# Patient Record
Sex: Female | Born: 1970 | Race: White | Hispanic: No | Marital: Married | Smoking: Never smoker
Health system: Southern US, Community
[De-identification: ages and names within clinical notes are randomized; demographics above are authoritative.]

## PROBLEM LIST (undated history)

## (undated) DIAGNOSIS — D649 Anemia, unspecified: Secondary | ICD-10-CM

## (undated) DIAGNOSIS — C541 Malignant neoplasm of endometrium: Secondary | ICD-10-CM

## (undated) DIAGNOSIS — K900 Celiac disease: Secondary | ICD-10-CM

## (undated) DIAGNOSIS — M1990 Unspecified osteoarthritis, unspecified site: Secondary | ICD-10-CM

## (undated) HISTORY — DX: Celiac disease: K90.0

## (undated) HISTORY — DX: Anemia, unspecified: D64.9

## (undated) HISTORY — PX: EYE SURGERY: SHX253

---

## 2000-11-12 ENCOUNTER — Other Ambulatory Visit: Admission: RE | Admit: 2000-11-12 | Discharge: 2000-11-12 | Payer: Self-pay | Admitting: Obstetrics and Gynecology

## 2002-01-21 ENCOUNTER — Emergency Department (HOSPITAL_COMMUNITY): Admission: EM | Admit: 2002-01-21 | Discharge: 2002-01-21 | Payer: Self-pay | Admitting: Emergency Medicine

## 2002-08-21 ENCOUNTER — Emergency Department (HOSPITAL_COMMUNITY): Admission: EM | Admit: 2002-08-21 | Discharge: 2002-08-21 | Payer: Self-pay | Admitting: Emergency Medicine

## 2002-09-01 ENCOUNTER — Ambulatory Visit (HOSPITAL_COMMUNITY): Admission: RE | Admit: 2002-09-01 | Discharge: 2002-09-01 | Payer: Self-pay | Admitting: Obstetrics and Gynecology

## 2002-09-01 ENCOUNTER — Encounter: Payer: Self-pay | Admitting: Obstetrics and Gynecology

## 2002-10-08 ENCOUNTER — Encounter: Payer: Self-pay | Admitting: Family Medicine

## 2002-10-08 ENCOUNTER — Ambulatory Visit (HOSPITAL_COMMUNITY): Admission: RE | Admit: 2002-10-08 | Discharge: 2002-10-08 | Payer: Self-pay | Admitting: Family Medicine

## 2007-05-06 ENCOUNTER — Other Ambulatory Visit: Admission: RE | Admit: 2007-05-06 | Discharge: 2007-05-06 | Payer: Self-pay | Admitting: Obstetrics and Gynecology

## 2007-12-17 ENCOUNTER — Ambulatory Visit (HOSPITAL_COMMUNITY): Admission: RE | Admit: 2007-12-17 | Discharge: 2007-12-17 | Payer: Self-pay | Admitting: Internal Medicine

## 2008-07-25 ENCOUNTER — Other Ambulatory Visit: Admission: RE | Admit: 2008-07-25 | Discharge: 2008-07-25 | Payer: Self-pay | Admitting: Obstetrics and Gynecology

## 2010-06-12 NOTE — Op Note (Signed)
NAMECYRSTAL, LEITZ               ACCOUNT NO.:  [id number]   MEDICAL RECORD NO.:  [id number]          PATIENT TYPE:  AMB   LOCATION:  DAY                           FACILITY:  [facility]   PHYSICIAN:  Lionel December, M.D.    DATE OF BIRTH:  1971/01/10   DATE OF PROCEDURE:  DATE OF DISCHARGE:                               OPERATIVE REPORT   PROCEDURE:  Colonoscopy.   INDICATION:  Konstantina is a 40 year old Caucasian female who had 3-week  period of hematochezia when she would pass bright red blood with bowel  movements almost on a daily basis.  She had not experienced abdominal  pain, diarrhea, and/or constipation.  Her bleeding has stopped  spontaneously.  She is undergoing diagnostic colonoscopy.  Procedure and  risks were reviewed with the patient and an informed consent was  obtained.   MEDICATIONS FOR CONSCIOUS SEDATION:  Demerol 20 mg IV and Versed 5 mg IV  in divided dose.   FINDINGS:  Procedure performed in endoscopy suite.  The patient's vital  signs and O2 sat were monitored during the procedure and remained  stable.  The patient was placed in left lateral recumbent position,  rectal examination performed.  No abnormality noted on external or  digital exam.  Pentax videoscope was placed in the rectum and advanced  under vision into the sigmoid colon and beyond.  Preparation was  satisfactory.  Scope was passed into the cecum, which was identified by  appendiceal orifice and ileocecal valve.  Pictures taken for the record.  As the scope was gradually withdrawn, colonic mucosa was carefully  examined and was normal throughout.  Rectal mucosa similarly was normal.  The scope was retroflexed to examine anorectal junction and hemorrhoids  noted below the dentate line ventral thickening to mucosa or skin of  anal canal.  Endoscope was straightened and withdrawn.  The patient  tolerated the procedure well.   Scope withdrawal time was 8 minutes.   FINAL DIAGNOSES:  External  hemorrhoids, otherwise normal colonoscopy.   RECOMMENDATIONS:  Standard instructions given.   The patient advised to keep symptom diary and if bleeding is recurrent,  she will give [place] a call.      Lionel December, M.D.  Electronically Signed     NR/MEDQ  D:  12/17/2007  T:  12/18/2007  Job:  [id number]   cc:   Jeoffrey Massed, MD  Fax: [phone]

## 2010-08-02 ENCOUNTER — Other Ambulatory Visit (HOSPITAL_COMMUNITY)
Admission: RE | Admit: 2010-08-02 | Discharge: 2010-08-02 | Disposition: A | Discharge status: Home / Self Care | Payer: BC Managed Care – PPO | Source: Ambulatory Visit | Attending: Obstetrics and Gynecology | Admitting: Obstetrics and Gynecology

## 2010-08-02 DIAGNOSIS — Z01419 Encounter for gynecological examination (general) (routine) without abnormal findings: Secondary | ICD-10-CM | POA: Insufficient documentation

## 2012-04-22 ENCOUNTER — Ambulatory Visit (INDEPENDENT_AMBULATORY_CARE_PROVIDER_SITE_OTHER): Payer: BC Managed Care – PPO | Admitting: Family Medicine

## 2012-04-22 ENCOUNTER — Encounter: Payer: Self-pay | Admitting: Family Medicine

## 2012-04-22 VITALS — BP 108/78 | Temp 98.9°F | Ht 66.0 in | Wt 171.4 lb

## 2012-04-22 DIAGNOSIS — J019 Acute sinusitis, unspecified: Secondary | ICD-10-CM

## 2012-04-22 DIAGNOSIS — K900 Celiac disease: Secondary | ICD-10-CM

## 2012-04-22 DIAGNOSIS — Z0000 Encounter for general adult medical examination without abnormal findings: Secondary | ICD-10-CM

## 2012-04-22 MED ORDER — AMOXICILLIN 500 MG PO TABS: 500.0000 mg | ORAL_TABLET | Freq: Two times a day (BID) | ORAL | Status: DC

## 2012-04-22 NOTE — Patient Instructions (Addendum)

## 2012-04-22 NOTE — Progress Notes (Signed)
  Subjective:    Patient ID: AMIYA ESCAMILLA, female    DOB: 21-Aug-1970, 42 y.o.   MRN: [id number]  Sinusitis This is a new problem. The current episode started 1 to 4 weeks ago. The problem has been gradually worsening since onset. There has been no fever. Associated symptoms include congestion, coughing, ear pain (left ear), headaches, shortness of breath, sinus pressure, sneezing and a sore throat. Pertinent negatives include no chills or hoarse voice. Past treatments include lying down. The treatment provided no relief.      Review of Systems  Constitutional: Negative for fever and chills.  HENT: Positive for ear pain (left ear), congestion, sore throat, sneezing and sinus pressure. Negative for hoarse voice.   Respiratory: Positive for cough and shortness of breath.   Cardiovascular: Negative.   Gastrointestinal: Negative.   Neurological: Positive for headaches.       Objective:   Physical Exam  Constitutional: She appears well-developed and well-nourished.  HENT:  Head: Normocephalic.  Right Ear: External ear normal.  Left Ear: External ear normal.  Neck: Normal range of motion.  Cardiovascular: Normal rate, regular rhythm and normal heart sounds.   Pulmonary/Chest: No respiratory distress. She has no wheezes.  Lymphadenopathy:    She has no cervical adenopathy.          Assessment & Plan:  Celiac disease  Acute sinusitis - Plan: amoxicillin (AMOXIL) 500 MG tablet  Routine general medical examination at a health care facility - Plan: Glucose, random, Lipid panel  Z celiac disease was historical. This was not something we handled today.  Fasting glucose and cholesterol was ordered for preventative she gets her wellness exams with her gynecologist.  Followup if worse.

## 2012-05-20 ENCOUNTER — Telehealth: Payer: Self-pay | Admitting: Adult Health

## 2012-05-20 NOTE — Telephone Encounter (Signed)
Had fax from [facility] 05/04/2012 requesting refill on sertraline 50 mg #30 1 daily, refilled x6 by fax

## 2012-12-02 ENCOUNTER — Ambulatory Visit (INDEPENDENT_AMBULATORY_CARE_PROVIDER_SITE_OTHER): Payer: BC Managed Care – PPO | Admitting: Family Medicine

## 2012-12-02 ENCOUNTER — Encounter: Payer: Self-pay | Admitting: Family Medicine

## 2012-12-02 VITALS — BP 122/82 | Temp 99.0°F | Ht 66.0 in | Wt 171.6 lb

## 2012-12-02 DIAGNOSIS — J019 Acute sinusitis, unspecified: Secondary | ICD-10-CM

## 2012-12-02 MED ORDER — CEFPROZIL 500 MG PO TABS: 500.0000 mg | ORAL_TABLET | Freq: Two times a day (BID) | ORAL | Status: DC

## 2012-12-02 MED ORDER — FLUCONAZOLE 150 MG PO TABS: 150.0000 mg | ORAL_TABLET | Freq: Once | ORAL | Status: DC

## 2012-12-02 NOTE — Progress Notes (Signed)
  Subjective:    Patient ID: Jennifer Cobb, female    DOB: 1970/12/23, 42 y.o.   MRN: [id number]  HPI  Patient arrives with congestion and body aches for one week.Began last Weds Home to day Headaches and bodyahes No sweats no energy PMH benign does not smoke relates head congestion sinus pressure pain discomfort symptoms over the past week started to get better then significantly got worse.  Review of Systems Denies vomiting diarrhea rash.    Objective:   Physical Exam Mild sinus tenderness eardrums normal throat normal neck supple lungs clear heart       Assessment & Plan:  Sinusitis antibiotics prescribed warning signs discussed followup ongoing troubles.

## 2013-01-11 ENCOUNTER — Other Ambulatory Visit: Payer: Self-pay | Admitting: Adult Health

## 2013-04-05 ENCOUNTER — Encounter: Payer: Self-pay | Admitting: Family Medicine

## 2013-04-05 ENCOUNTER — Ambulatory Visit (INDEPENDENT_AMBULATORY_CARE_PROVIDER_SITE_OTHER): Payer: BC Managed Care – PPO | Admitting: Family Medicine

## 2013-04-05 VITALS — BP 128/82 | Temp 99.4°F | Ht 66.0 in | Wt 166.4 lb

## 2013-04-05 DIAGNOSIS — J329 Chronic sinusitis, unspecified: Secondary | ICD-10-CM

## 2013-04-05 MED ORDER — FLUCONAZOLE 150 MG PO TABS: ORAL_TABLET | ORAL | Status: DC

## 2013-04-05 MED ORDER — CEFDINIR 300 MG PO CAPS: 300.0000 mg | ORAL_CAPSULE | Freq: Two times a day (BID) | ORAL | Status: DC

## 2013-04-05 NOTE — Progress Notes (Signed)
   Subjective:    Patient ID: Kristie CowmanMarcia O Vacha, female    DOB: Jul 28, 1970, 43 y.o.   MRN: [id number]  Fever  This is a new problem. The current episode started in the past 7 days. Associated symptoms include headaches and nausea.  body aches and fatigue  Sig headache  tmax and fever of felt warm  Nausea no vom  Alka selt plus prn  hus has head cold  No work tod or church sunday  No cough, nasal congestion  Appetite same  Energy poor  Review of Systems  Constitutional: Positive for fever.  Gastrointestinal: Positive for nausea.  Neurological: Positive for headaches.       Objective:   Physical Exam  Alert moderate malaise. H&T moderate his congestion pharynx normal temp 99.4 lungs clear heart regular in rhythm.      Assessment & Plan:  Impression post flu sinusitis plan antibiotics prescribed. Symptomatic care discussed. Warning signs discussed. WSL

## 2013-09-11 ENCOUNTER — Other Ambulatory Visit: Payer: Self-pay | Admitting: Adult Health

## 2013-09-15 ENCOUNTER — Ambulatory Visit (INDEPENDENT_AMBULATORY_CARE_PROVIDER_SITE_OTHER): Payer: BC Managed Care – PPO | Admitting: Nurse Practitioner

## 2013-09-15 ENCOUNTER — Encounter: Payer: Self-pay | Admitting: Nurse Practitioner

## 2013-09-15 VITALS — BP 122/78 | Ht 65.0 in | Wt 164.0 lb

## 2013-09-15 DIAGNOSIS — Z1231 Encounter for screening mammogram for malignant neoplasm of breast: Secondary | ICD-10-CM

## 2013-09-15 DIAGNOSIS — Z01419 Encounter for gynecological examination (general) (routine) without abnormal findings: Secondary | ICD-10-CM

## 2013-09-15 DIAGNOSIS — Z800 Family history of malignant neoplasm of digestive organs: Secondary | ICD-10-CM

## 2013-09-15 DIAGNOSIS — Z124 Encounter for screening for malignant neoplasm of cervix: Secondary | ICD-10-CM

## 2013-09-15 DIAGNOSIS — Z0000 Encounter for general adult medical examination without abnormal findings: Secondary | ICD-10-CM

## 2013-09-16 LAB — PAP IG W/ RFLX HPV ASCU

## 2013-09-17 ENCOUNTER — Encounter: Payer: Self-pay | Admitting: Nurse Practitioner

## 2013-09-17 DIAGNOSIS — Z800 Family history of malignant neoplasm of digestive organs: Secondary | ICD-10-CM | POA: Insufficient documentation

## 2013-09-17 LAB — HUMAN PAPILLOMAVIRUS, HIGH RISK: HPV DNA HIGH RISK: NOT DETECTED

## 2013-09-17 NOTE — Progress Notes (Signed)
   Subjective:    Patient ID: Jennifer Cobb, female    DOB: [birth year], 43 y.o.   MRN: [id number]  HPI presents for her wellness exam. Married, same sexual partner. Her husband has had a vasectomy. Regular cycles about every 3 weeks, lasting 5 days with average to heavy flow. Regular vision and dental exams. Has had normal Pap smears in the past, has not had one in at least 2-3 years. 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 No copy available during office visit. Patient has been told it is normal. Gets regular colonoscopies due to her family history, her mother had colon cancer at age 45. Active lifestyle. Overall healthy diet. Unsure about her tetanus status.    Review of Systems  Constitutional: Negative for activity change, appetite change and fatigue.  HENT: Positive for rhinorrhea. Negative for dental problem, ear pain, sinus pressure and sore throat.   Respiratory: Negative for cough, chest tightness, shortness of breath and wheezing.   Cardiovascular: Negative for chest pain and leg swelling.  Gastrointestinal: Positive for constipation. Negative for nausea, vomiting, abdominal pain, diarrhea, blood in stool and abdominal distention.  Genitourinary: Negative for dysuria, urgency, frequency, vaginal discharge, enuresis, difficulty urinating, genital sores, menstrual problem and pelvic pain.       Objective:   Physical Exam  Vitals reviewed. Constitutional: She is oriented to person, place, and time. She appears well-developed. No distress.  HENT:  Right Ear: External ear normal.  Left Ear: External ear normal.  Mouth/Throat: Oropharynx is clear and moist.  Neck: Normal range of motion. Neck supple. No tracheal deviation present. No thyromegaly present.  Cardiovascular: Normal rate, regular rhythm and normal heart sounds.  Exam reveals no gallop.   No murmur heard. Pulmonary/Chest: Effort normal and breath sounds normal.  Abdominal: Soft. She exhibits no distension. There is no tenderness.    Genitourinary: Vagina normal and uterus normal. No vaginal discharge found.  External GU no rashes or lesions noted. Vagina no discharge. No CMT. Bimanual exam nontender without obvious masses.  Musculoskeletal: She exhibits no edema.  Lymphadenopathy:    She has no cervical adenopathy.  Neurological: She is alert and oriented to person, place, and time.  Skin: Skin is warm and dry. No rash noted.  Psychiatric: She has a normal mood and affect. Her behavior is normal.   breast exam dense tissue with fine nodularity, no dominant masses. Axilla no adenopathy.        Assessment & Plan:  Well woman exam - Plan: Pap IG w/ reflex to HPV when ASC-U  Screening for cervical cancer - Plan: Pap IG w/ reflex to HPV when ASC-U  Other screening mammogram - Plan: MM DIGITAL SCREENING BILATERAL  Family history of colon cancer  Encouraged healthy diet regular activity and daily vitamin D/calcium supplementation. Followup with GI specialist regarding colonoscopy. Return in about 1 year (around 09/16/2014). Recommend tetanus shot at local pharmacy because of insurance coverage.

## 2013-09-22 ENCOUNTER — Ambulatory Visit (HOSPITAL_COMMUNITY): Payer: BC Managed Care – PPO

## 2013-09-24 ENCOUNTER — Ambulatory Visit (HOSPITAL_COMMUNITY)
Admission: RE | Admit: 2013-09-24 | Discharge: 2013-09-24 | Disposition: A | Discharge status: Home / Self Care | Payer: BC Managed Care – PPO | Source: Ambulatory Visit | Attending: Nurse Practitioner | Admitting: Nurse Practitioner

## 2013-09-24 ENCOUNTER — Ambulatory Visit (HOSPITAL_COMMUNITY): Payer: BC Managed Care – PPO

## 2013-09-24 DIAGNOSIS — Z1231 Encounter for screening mammogram for malignant neoplasm of breast: Secondary | ICD-10-CM | POA: Insufficient documentation

## 2013-11-18 ENCOUNTER — Encounter: Payer: Self-pay | Admitting: Family Medicine

## 2013-11-18 ENCOUNTER — Ambulatory Visit (INDEPENDENT_AMBULATORY_CARE_PROVIDER_SITE_OTHER): Payer: BC Managed Care – PPO | Admitting: Family Medicine

## 2013-11-18 VITALS — BP 118/80 | Temp 98.2°F | Ht 65.0 in | Wt 165.0 lb

## 2013-11-18 DIAGNOSIS — J0100 Acute maxillary sinusitis, unspecified: Secondary | ICD-10-CM

## 2013-11-18 DIAGNOSIS — B349 Viral infection, unspecified: Secondary | ICD-10-CM

## 2013-11-18 MED ORDER — CEFPROZIL 500 MG PO TABS: 500.0000 mg | ORAL_TABLET | Freq: Two times a day (BID) | ORAL | Status: DC

## 2013-11-18 NOTE — Progress Notes (Signed)
   Subjective:    Patient ID: Jennifer Cobb, female    DOB: August 29, 1970, 43 y.o.   MRN: [id number]  Cough This is a new problem. The current episode started yesterday. The problem has been gradually worsening. The cough is non-productive. Associated symptoms include myalgias. Pertinent negatives include no chest pain, ear pain, fever, rhinorrhea, shortness of breath or wheezing. Nothing aggravates the symptoms. She has tried OTC cough suppressant (Tylenol yesterday ) for the symptoms. The treatment provided mild relief.    Minimal drainage Slight cough Pressure in the ears Some chill feeling No V or D  Review of Systems  Constitutional: Negative for fever and activity change.  HENT: Negative for congestion, ear pain and rhinorrhea.   Eyes: Negative for discharge.  Respiratory: Positive for cough. Negative for shortness of breath and wheezing.   Cardiovascular: Negative for chest pain.  Musculoskeletal: Positive for myalgias.       Objective:   Physical Exam  Nursing note and vitals reviewed. Constitutional: She appears well-developed.  HENT:  Head: Normocephalic.  Nose: Nose normal.  Mouth/Throat: Oropharynx is clear and moist. No oropharyngeal exudate.  Neck: Neck supple.  Cardiovascular: Normal rate and normal heart sounds.   No murmur heard. Pulmonary/Chest: Effort normal and breath sounds normal. She has no wheezes.  Lymphadenopathy:    She has no cervical adenopathy.  Skin: Skin is warm and dry.          Assessment & Plan:  Parainfluenza illness along with secondary sinusitis antibiotics prescribed warning signs discussed she is not to get the antibiotics filled unless her symptoms do not improve over the next several days certainly call if worse

## 2014-01-07 ENCOUNTER — Telehealth: Payer: Self-pay | Admitting: Family Medicine

## 2014-01-07 MED ORDER — ALBUTEROL SULFATE (2.5 MG/3ML) 0.083% IN NEBU: 2.5000 mg | INHALATION_SOLUTION | Freq: Four times a day (QID) | RESPIRATORY_TRACT | Status: DC | PRN

## 2014-01-07 NOTE — Telephone Encounter (Signed)
May give rx for 1 box albuterol vials , follow up if ongoing or worse, 1 refill as well

## 2014-01-07 NOTE — Telephone Encounter (Signed)
Patient notified and verbalized understanding. 

## 2014-01-07 NOTE — Telephone Encounter (Signed)
Patient had a bad cough for the past week.  She used 2 tubes of her child(Bryan's) breathing machine and it helped her tremendously. She wants to know if she can get her own Rx for refills on the breathing machine?  [facility]

## 2014-09-15 ENCOUNTER — Other Ambulatory Visit: Payer: Self-pay | Admitting: Adult Health

## 2014-10-19 ENCOUNTER — Ambulatory Visit (INDEPENDENT_AMBULATORY_CARE_PROVIDER_SITE_OTHER): Payer: BC Managed Care – PPO | Admitting: Nurse Practitioner

## 2014-10-19 ENCOUNTER — Encounter: Payer: Self-pay | Admitting: Family Medicine

## 2014-10-19 ENCOUNTER — Encounter: Payer: Self-pay | Admitting: Nurse Practitioner

## 2014-10-19 VITALS — BP 112/74 | Ht 65.0 in | Wt 169.0 lb

## 2014-10-19 DIAGNOSIS — M25511 Pain in right shoulder: Secondary | ICD-10-CM | POA: Diagnosis not present

## 2014-10-19 NOTE — Progress Notes (Signed)
Subjective:  Presents complaints of right shoulder pain that began 4 days ago. Has been slowly progressive, extremely bad last night, unable to sleep. Unable to move her shoulder. Has been wearing a sling since last night to help the pain. No specific history of injury. Has tried ice Aleve and ibuprofen with minimal improvement.  Objective:   BP 112/74 mmHg  Ht  (1.651 m)  Wt 169 lb (76.658 kg)  BMI 28.12 kg/m2 NAD. Alert, oriented. Nodularity with tenderness noted near the before meals area of the right shoulder in the anterior joint line. Patient is unable to move the shoulder for exam. Light palpation of the shoulder joint line produces extreme pain.  Assessment: Right shoulder pain  Plan: Patient given appointment for 1:00 today for local orthopedic specialist. Recheck here as needed.

## 2014-10-28 ENCOUNTER — Ambulatory Visit (INDEPENDENT_AMBULATORY_CARE_PROVIDER_SITE_OTHER): Payer: BC Managed Care – PPO | Admitting: Obstetrics & Gynecology

## 2014-10-28 ENCOUNTER — Encounter: Payer: Self-pay | Admitting: Obstetrics & Gynecology

## 2014-10-28 VITALS — BP 110/80 | HR 72 | Wt 162.3 lb

## 2014-10-28 DIAGNOSIS — F329 Major depressive disorder, single episode, unspecified: Secondary | ICD-10-CM

## 2014-10-28 DIAGNOSIS — F32A Depression, unspecified: Secondary | ICD-10-CM

## 2014-10-28 MED ORDER — SERTRALINE HCL 50 MG PO TABS: ORAL_TABLET | ORAL | Status: DC

## 2014-10-28 NOTE — Progress Notes (Signed)
Patient ID: Jennifer Cobb, female   DOB: 02/23/1970, 44 y.o.   MRN: [id number] Chief Complaint  Patient presents with  . Medication Refill    Zoloft    Blood pressure 110/80, pulse 72, weight 162 lb 4.8 oz (73.619 kg), last menstrual period 10/16/2014.  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  The summary and outline of the counseling and care coordination is summarized in the note above.   All questions were answered.

## 2015-03-27 ENCOUNTER — Encounter: Payer: Self-pay | Admitting: Family Medicine

## 2015-03-27 ENCOUNTER — Ambulatory Visit (INDEPENDENT_AMBULATORY_CARE_PROVIDER_SITE_OTHER): Payer: BC Managed Care – PPO | Admitting: Family Medicine

## 2015-03-27 VITALS — BP 112/68 | Temp 101.0°F | Ht 65.0 in | Wt 172.0 lb

## 2015-03-27 DIAGNOSIS — J111 Influenza due to unidentified influenza virus with other respiratory manifestations: Secondary | ICD-10-CM | POA: Diagnosis not present

## 2015-03-27 MED ORDER — OSELTAMIVIR PHOSPHATE 75 MG PO CAPS: 75.0000 mg | ORAL_CAPSULE | Freq: Two times a day (BID) | ORAL | Status: DC

## 2015-03-27 NOTE — Progress Notes (Signed)
   Subjective:    Patient ID: Jennifer Cobb, female    DOB: 1970-02-17, 45 y.o.   MRN: [id number]  Cough This is a new problem. Episode onset: 2days ago. Associated symptoms include a fever, headaches, nasal congestion, rhinorrhea and a sore throat. Pertinent negatives include no chest pain, ear pain, shortness of breath or wheezing. She has tried nothing for the symptoms.   Patient states Friday Saturday and Sunday morning start sore throat then progressed throughout the day congestion. Monday morning fever chills body aches cough runny  Symptoms over the past couple days with body aches fever chills not feeling good denies nausea vomiting diarrhea Review of Systems  Constitutional: Positive for fever. Negative for activity change.  HENT: Positive for congestion, rhinorrhea and sore throat. Negative for ear pain.   Eyes: Negative for discharge.  Respiratory: Positive for cough. Negative for shortness of breath and wheezing.   Cardiovascular: Negative for chest pain.  Neurological: Positive for headaches.       Objective:   Physical Exam  Constitutional: She appears well-developed.  HENT:  Head: Normocephalic.  Nose: Nose normal.  Mouth/Throat: Oropharynx is clear and moist. No oropharyngeal exudate.  Neck: Neck supple.  Cardiovascular: Normal rate and normal heart sounds.   No murmur heard. Pulmonary/Chest: Effort normal and breath sounds normal. She has no wheezes.  Lymphadenopathy:    She has no cervical adenopathy.  Skin: Skin is warm and dry.  Nursing note and vitals reviewed.  Patient not toxic she was warned what signs to watch for       Assessment & Plan:  Influenza-the patient was diagnosed with influenza. Patient/family educated about the flu and warning signs to watch for. If difficulty breathing, severe neck pain and stiffness, cyanosis, disorientation, or progressive worsening then immediately get rechecked at that ER. If progressive symptoms be certain to be  rechecked. Supportive measures such as Tylenol/ibuprofen was discussed. No aspirin use in children. And influenza home care instruction sheet was given. Patient does not appear toxic Tamiflu prescribed follow-up breast troubles warning signs were discussed

## 2015-03-27 NOTE — Patient Instructions (Signed)

## 2015-10-05 ENCOUNTER — Encounter: Payer: Self-pay | Admitting: Adult Health

## 2015-10-05 ENCOUNTER — Ambulatory Visit (INDEPENDENT_AMBULATORY_CARE_PROVIDER_SITE_OTHER): Payer: BC Managed Care – PPO | Admitting: Adult Health

## 2015-10-05 VITALS — BP 100/80 | HR 68 | Ht 65.0 in | Wt 179.5 lb

## 2015-10-05 DIAGNOSIS — Z3202 Encounter for pregnancy test, result negative: Secondary | ICD-10-CM | POA: Diagnosis not present

## 2015-10-05 DIAGNOSIS — N921 Excessive and frequent menstruation with irregular cycle: Secondary | ICD-10-CM

## 2015-10-05 LAB — POCT HEMOGLOBIN: HEMOGLOBIN: 12.8 g/dL (ref 12.2–16.2)

## 2015-10-05 LAB — POCT URINE PREGNANCY: PREG TEST UR: NEGATIVE

## 2015-10-05 MED ORDER — MEGESTROL ACETATE 40 MG PO TABS: ORAL_TABLET | ORAL | 1 refills | Status: DC

## 2015-10-05 NOTE — Progress Notes (Signed)
Subjective:     Patient ID: Jennifer CowmanMarcia O Cobb, female   DOB: Jun 06, 1970, 45 y.o.   MRN: [id number]  HPI [name] is a 45 year old white female, married in complaining of bleeding since 8/5, heavy at times, is moderate today. She says periods have been regular til July and skipped July and then started 8/5 and still bleeding.   Review of Systems + bleeding since 8/5 Skipped period in July Reviewed past medical,surgical, social and family history. Reviewed medications and allergies.     Objective:   Physical Exam BP 100/80 (BP Location: Left Arm, Patient Position: Sitting, Cuff Size: Normal)   Pulse 68   Ht 5\' 5"  (1.651 m)   Wt 179 lb 8 oz (81.4 kg)   LMP 09/02/2015 (Exact Date) Comment: ongoing bleeding  BMI 29.87 kg/m    UPT negative, HGB 12.8, Skin warm and dry.Pelvic: external genitalia is normal in appearance no lesions, vagina: period like blood,urethra has no lesions or masses noted, cervix:smooth and bulbous, uterus: normal size, shape and contour, non tender, no masses felt, adnexa: no masses or tenderness noted. Bladder is non tender and no masses felt.  Will RX megace to stop bleeding and get US to assess uterus but discussed that may be related to peri menopause.  Assessment:     1. Menorrhagia with irregular cycle   2. Pregnancy examination or test, negative result       Plan:         Rx megace 40 mg #45 3 x 5 days then 2 x 5 days then 1 daily with 1 refill Return next week for GYN US Review handout on menorrhagia

## 2015-10-05 NOTE — Patient Instructions (Signed)

## 2015-10-10 ENCOUNTER — Ambulatory Visit (INDEPENDENT_AMBULATORY_CARE_PROVIDER_SITE_OTHER): Payer: BC Managed Care – PPO

## 2015-10-10 DIAGNOSIS — N854 Malposition of uterus: Secondary | ICD-10-CM

## 2015-10-10 DIAGNOSIS — N921 Excessive and frequent menstruation with irregular cycle: Secondary | ICD-10-CM | POA: Diagnosis not present

## 2015-10-10 DIAGNOSIS — N83201 Unspecified ovarian cyst, right side: Secondary | ICD-10-CM

## 2015-10-10 NOTE — Progress Notes (Addendum)
PELVIC US TA/TV: Homogeneous anteverted uterus,EEC 15.2 mm,normal lt ov,simple rt ov cyst 2.5 x 3 x 2.0 cm,small amount of cul de sac fluid,ov's appear mobile,no pain during ultrasound.

## 2015-10-11 ENCOUNTER — Telehealth: Payer: Self-pay | Admitting: Adult Health

## 2015-10-11 NOTE — Telephone Encounter (Signed)
Mailbox full

## 2015-10-16 ENCOUNTER — Telehealth: Payer: Self-pay | Admitting: Adult Health

## 2015-10-16 MED ORDER — MEDROXYPROGESTERONE ACETATE 10 MG PO TABS: 10.0000 mg | ORAL_TABLET | Freq: Every day | ORAL | 0 refills | Status: DC

## 2015-10-16 NOTE — Telephone Encounter (Signed)
Pt aware US showed thickened endometrium and to stop megace and take provera 10 mg for 14 days to slough tissue,if bleeding continues may get F/U [place]

## 2015-10-16 NOTE — Telephone Encounter (Signed)
Left message to call about US  

## 2015-10-16 NOTE — Telephone Encounter (Signed)
Pt states that she would like to know her results, please contact pt

## 2015-10-18 NOTE — Telephone Encounter (Signed)
JAG spoke with pt regarding US results. Encounter closed. JSY

## 2015-11-02 ENCOUNTER — Telehealth: Payer: Self-pay | Admitting: Adult Health

## 2015-11-02 NOTE — Telephone Encounter (Signed)
She took the provera and is bleeding now, if it lasts over 7 days call me, will get F/U US to assess lining

## 2015-11-10 ENCOUNTER — Other Ambulatory Visit: Payer: Self-pay | Admitting: Obstetrics & Gynecology

## 2016-03-11 ENCOUNTER — Ambulatory Visit (INDEPENDENT_AMBULATORY_CARE_PROVIDER_SITE_OTHER): Payer: BC Managed Care – PPO | Admitting: Family Medicine

## 2016-03-11 VITALS — BP 110/78 | Temp 98.4°F | Wt 182.2 lb

## 2016-03-11 DIAGNOSIS — R070 Pain in throat: Secondary | ICD-10-CM

## 2016-03-11 DIAGNOSIS — R21 Rash and other nonspecific skin eruption: Secondary | ICD-10-CM

## 2016-03-11 LAB — POCT RAPID STREP A (OFFICE): Rapid Strep A Screen: NEGATIVE

## 2016-03-11 MED ORDER — TRIAMCINOLONE ACETONIDE 0.1 % EX CREA: TOPICAL_CREAM | CUTANEOUS | 2 refills | Status: DC

## 2016-03-11 NOTE — Progress Notes (Signed)
   Subjective:    Patient ID: Jennifer Cobb, female    DOB: 06/26/1970, 46 y.o.   MRN: [id number]  HPI Patient presents to office today c/o low grade temp (<99), fatigue, and throat pain; all started Friday.  She states Dayquil has helped some with afore mentioned symptoms. She c/o rash on back of neck that started 6 months ago. She states rash itches and is painful.  She states she has applied "bite/itch" lotion with some relief.  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   Got a flu shto   dayquil  Little cough   Mild headach ibu prn for symptoms    Rash back of neck, itches at times   Results for orders placed or performed in visit on 10/05/15  POCT urine pregnancy  Result Value Ref Range   Preg Test, Ur Negative Negative  POCT hemoglobin  Result Value Ref Range   Hemoglobin 12.8 12.2 - 16.2 g/dL   Results for orders placed or performed in visit on 03/11/16  POCT rapid strep A  Result Value Ref Range   Rapid Strep A Screen Negative Negative    Some achiness    Review of Systems No headache, no major weight loss or weight gain, no chest pain no back pain abdominal pain no change in bowel habits complete ROS otherwise negative     Objective:   Physical Exam  ,be Alert vitals stable, NAD. Blood pressure good on repeat. HEENTSlight nasal congestion slight erythema pharynx otherwise normal. Lungs clear. Heart regular rate and rhythm. Posterior neck eczema-like changes are catching nature      Assessment & Plan:  Impression 1 eczema neck #2 viral syndrome with pharyngitis negative strep screen. Patient is a Runner, broadcasting/film/videoteacher with many exposures plan symptom care for throat. 1 cream twice a day to neck symptom care discussed WSL

## 2016-03-13 LAB — CULTURE, GROUP A STREP

## 2016-03-14 MED ORDER — PENICILLIN V POTASSIUM 500 MG PO TABS: 500.0000 mg | ORAL_TABLET | Freq: Three times a day (TID) | ORAL | 0 refills | Status: DC

## 2016-03-14 NOTE — Addendum Note (Signed)
Addended by: Margaretha SheffieldBROWN, Lorena Benham S on: 03/14/2016 04:41 PM   Modules accepted: Orders

## 2016-06-27 ENCOUNTER — Encounter: Payer: Self-pay | Admitting: Family Medicine

## 2016-06-27 ENCOUNTER — Ambulatory Visit (INDEPENDENT_AMBULATORY_CARE_PROVIDER_SITE_OTHER): Payer: BC Managed Care – PPO | Admitting: Family Medicine

## 2016-06-27 VITALS — BP 100/74 | Temp 98.7°F | Ht 65.0 in | Wt 181.5 lb

## 2016-06-27 DIAGNOSIS — M542 Cervicalgia: Secondary | ICD-10-CM

## 2016-06-27 MED ORDER — CHLORZOXAZONE 500 MG PO TABS: 500.0000 mg | ORAL_TABLET | Freq: Four times a day (QID) | ORAL | 0 refills | Status: DC | PRN

## 2016-06-27 MED ORDER — DICLOFENAC SODIUM 75 MG PO TBEC
75.0000 mg | DELAYED_RELEASE_TABLET | Freq: Two times a day (BID) | ORAL | 0 refills | Status: DC
Start: 2016-06-27 — End: 2020-08-10

## 2016-06-27 NOTE — Progress Notes (Signed)
   Subjective:    Patient ID: Jennifer Cobb, female    DOB: April 09, 1970, 46 y.o.   MRN: [id number]  Neck Pain   This is a new problem. The current episode started in the past 7 days. The symptoms are aggravated by position. Stiffness is present at night. She has tried heat, ice and NSAIDs for the symptoms.   States no other concerns this visit.  Review of Systems  Musculoskeletal: Positive for neck pain.       Objective:   Physical Exam On examination she has some discomfort in the posterior aspect neck it does not have the appearance meningitis is more along the musculoskeletal region both posterior right and posterior left hurts with looking up and looking to the right. No numbness or radiation down the arm.       Assessment & Plan:  Cervical pain-muscle relaxer, anti-inflammatory, gentle range of motion, pain medicine not necessary, if progressive symptoms over next 6 weeks follow-up May need MRI.

## 2016-08-27 ENCOUNTER — Encounter: Payer: Self-pay | Admitting: Family Medicine

## 2016-09-08 ENCOUNTER — Encounter: Payer: Self-pay | Admitting: Family Medicine

## 2016-11-11 ENCOUNTER — Other Ambulatory Visit: Payer: Self-pay | Admitting: Obstetrics & Gynecology

## 2017-05-21 ENCOUNTER — Encounter: Payer: Self-pay | Admitting: Family Medicine

## 2017-05-22 NOTE — Telephone Encounter (Signed)
That is fine by me please assist family with this

## 2017-12-04 ENCOUNTER — Other Ambulatory Visit: Payer: Self-pay | Admitting: Obstetrics & Gynecology

## 2017-12-12 ENCOUNTER — Ambulatory Visit: Payer: BC Managed Care – PPO | Admitting: Family Medicine

## 2017-12-12 ENCOUNTER — Encounter: Payer: Self-pay | Admitting: Family Medicine

## 2017-12-12 VITALS — BP 128/80 | Temp 98.3°F | Ht 65.0 in | Wt 183.0 lb

## 2017-12-12 DIAGNOSIS — J069 Acute upper respiratory infection, unspecified: Secondary | ICD-10-CM

## 2017-12-12 NOTE — Progress Notes (Signed)
  Subjective:     Patient ID: Jennifer CowmanMarcia O Cobb, female   DOB: [birth year], 47 y.o.   MRN: [id number]  Sinusitis  This is a new problem. The current episode started yesterday. Associated symptoms include congestion, headaches, sneezing and a sore throat. Pertinent negatives include no chills, coughing, ear pain or shortness of breath. (Bodyaches,) Treatments tried: nyquil, antihistamine.   Yesterday started with sore throat, congestion, sneezing, intermittent frontal h/a, and body aches. Has not checked temp at home. No cough. Congestion, producing green mucous at times when she blows her nose. Has been taking tylenol and an antihistamine, which has been helpful. Works at an [employer]. Stayed home from work today.   Review of Systems  Constitutional: Negative for appetite change, chills and fever.  HENT: Positive for congestion, sneezing and sore throat. Negative for ear pain.   Eyes: Negative for discharge.  Respiratory: Negative for cough, shortness of breath and wheezing.   Gastrointestinal: Negative for diarrhea, nausea and vomiting.  Neurological: Positive for headaches.       Objective:   Physical Exam  Constitutional: She is oriented to person, place, and time. She appears well-developed and well-nourished. No distress.  HENT:  Head: Normocephalic and atraumatic.  Right Ear: Tympanic membrane normal.  Left Ear: Tympanic membrane normal.  Nose: Nose normal. No sinus tenderness.  Mouth/Throat: Uvula is midline and oropharynx is clear and moist.  Eyes: Right eye exhibits no discharge. Left eye exhibits no discharge.  Neck: Neck supple.  Cardiovascular: Normal rate, regular rhythm and normal heart sounds.  No murmur heard. Pulmonary/Chest: Effort normal and breath sounds normal. No respiratory distress. She has no wheezes.  Lymphadenopathy:    She has no cervical adenopathy.  Neurological: She is alert and oriented to person, place, and time.  Skin: Skin is warm and dry.   Psychiatric: She has a normal mood and affect.  Nursing note and vitals reviewed.      Assessment:     Viral URI      Plan:     Most likely viral etiology at this point in time. Do not feel this is strep throat or the flu. Recommend symptomatic care, may use OTC generic nasal decongestant spray x 3 days to help with congestion symptoms. Warning signs discussed, natural course of viral illnesses discussed. F/u if symptoms worsen or fail to improve.

## 2017-12-12 NOTE — Patient Instructions (Signed)
May use over the counter store brand of Afrin nasal decongestant spray, two times per day for 3 days. Do not use for longer than 3 days.

## 2018-04-13 ENCOUNTER — Other Ambulatory Visit: Payer: Self-pay

## 2018-04-13 ENCOUNTER — Ambulatory Visit (INDEPENDENT_AMBULATORY_CARE_PROVIDER_SITE_OTHER): Payer: BC Managed Care – PPO | Admitting: Family Medicine

## 2018-04-13 VITALS — Temp 98.7°F

## 2018-04-13 DIAGNOSIS — J069 Acute upper respiratory infection, unspecified: Secondary | ICD-10-CM

## 2018-04-13 DIAGNOSIS — R509 Fever, unspecified: Secondary | ICD-10-CM | POA: Diagnosis not present

## 2018-04-13 LAB — POCT INFLUENZA A/B
INFLUENZA B, POC: NEGATIVE
Influenza A, POC: NEGATIVE

## 2018-04-13 MED ORDER — OSELTAMIVIR PHOSPHATE 75 MG PO CAPS: 75.0000 mg | ORAL_CAPSULE | Freq: Two times a day (BID) | ORAL | 0 refills | Status: DC

## 2018-04-13 NOTE — Progress Notes (Signed)
   Subjective:    Patient ID: Jennifer Cobb, female    DOB: [birth year], 48 y.o.   MRN: [id number]  Fever   This is a new problem. The current episode started in the past 7 days. Associated symptoms include congestion, headaches, muscle aches and a sore throat. She has tried acetaminophen and NSAIDs for the symptoms.   Patient not feeling well relates a lot of body aches fever chills denies any headaches wheezing difficulty breathing nausea vomiting diarrhea  She denies coughing currently Review of Systems  Constitutional: Positive for fever.  HENT: Positive for congestion and sore throat.   Neurological: Positive for headaches.       Objective:   Physical Exam Vitals signs reviewed.  Constitutional:      General: She is not in acute distress. HENT:     Head: Normocephalic.  Cardiovascular:     Rate and Rhythm: Normal rate and regular rhythm.     Heart sounds: Normal heart sounds. No murmur.  Pulmonary:     Effort: Pulmonary effort is normal.     Breath sounds: Normal breath sounds.  Lymphadenopathy:     Cervical: No cervical adenopathy.  Neurological:     Mental Status: She is alert.  Psychiatric:        Behavior: Behavior normal.    Nasal swab taken Flu test negative Patient looks to feel ill but does not appear toxic O2 sat okay Not tachypneic        Assessment & Plan:  Viral syndrome Probable flu Tamiflu twice daily for 5 days If worse over the course the next 48 hours to follow-up Follow-up sooner if problems Warnings discussed

## 2018-04-14 ENCOUNTER — Telehealth: Payer: Self-pay | Admitting: Family Medicine

## 2018-04-14 NOTE — Telephone Encounter (Signed)
I did do a phone call for discussion with the patient  She states she is starting to feel a little bit better less body aches.  Having some postnasal drainage and sore throat able to eat breakfast okay.  No breathing difficulties no shortness of breath no coughing.  Tolerating her medicine.  Patient was told that she starts experiencing cough with shortness of breath to immediately notify us and we would help set her up at the hospital.  She will touch base with [place] tomorrow.

## 2018-07-24 ENCOUNTER — Ambulatory Visit (INDEPENDENT_AMBULATORY_CARE_PROVIDER_SITE_OTHER): Payer: BC Managed Care – PPO | Admitting: Nurse Practitioner

## 2018-07-24 ENCOUNTER — Other Ambulatory Visit: Payer: Self-pay

## 2018-07-24 DIAGNOSIS — H6692 Otitis media, unspecified, left ear: Secondary | ICD-10-CM

## 2018-07-24 DIAGNOSIS — M62838 Other muscle spasm: Secondary | ICD-10-CM | POA: Diagnosis not present

## 2018-07-24 MED ORDER — CEFDINIR 300 MG PO CAPS: 300.0000 mg | ORAL_CAPSULE | Freq: Two times a day (BID) | ORAL | 0 refills | Status: DC

## 2018-07-24 NOTE — Progress Notes (Signed)
   Subjective:  VIRTUAL VISIT   Patient ID: Jennifer Cobb, female    DOB: [birth year], 48 y.o.   MRN: [id number]  Neck Pain  This is a new problem. The current episode started in the past 7 days.   Patient also having left ear pain  Review of Systems  Musculoskeletal: Positive for neck pain.   Virtual Visit via Video Note  I connected with Jennifer Cobb on 07/24/18 at  3:40 PM EDT by a video enabled telemedicine application and verified that I am speaking with the correct person using two identifiers.  Location: Patient: home Provider: office   I discussed the limitations of evaluation and management by telemedicine and the availability of in person appointments. The patient expressed understanding and agreed to proceed.  History of Present Illness: Presents for complaints of left ear pain for the past 3 days.  No fever.  No drainage.  Has not been swimming.  Occasional pulsatile type pain.  No sore throat.  No cough.  No sinus headache.  Has been taking OTC analgesics for her discomfort.  Also having some pain along the left lateral neck area.   Observations/Objective: Format  Patient present at home Provider present at office Consent for interaction obtained Coronavirus outbreak made virtual visit necessary  NAD.  Alert, oriented.  Patient has tenderness when she palpates along the left sternocleidomastoid area up into the posterior auricular area.  Patient denies any lymphadenopathy or masses with palpation of the neck area.  Can perform lateral ROM of the neck with only mild tenderness.  Assessment and Plan:   ICD-10-CM   1. Left otitis media, unspecified otitis media type  H66.92   2. Muscle spasms of neck  M62.838    Meds ordered this encounter  Medications  . cefdinir (OMNICEF) 300 MG capsule    Sig: Take 1 capsule (300 mg total) by mouth 2 (two) times daily.    Dispense:  20 capsule    Refill:  0    Order Specific Question:   Supervising Provider    Answer:    Sallee Lange A [[id number]]     Follow Up Instructions: Start antibiotics as directed.  Reviewed symptomatic care for sinus congestion.  Ice/heat applications and gentle massage to the left neck area.  Stretching exercises.  Call back next week if no improvement in her symptoms, sooner if worse.  Warning signs reviewed.   I discussed the assessment and treatment plan with the patient. The patient was provided an opportunity to ask questions and all were answered. The patient agreed with the plan and demonstrated an understanding of the instructions.   The patient was advised to call back or seek an in-person evaluation if the symptoms worsen or if the condition fails to improve as anticipated.  I provided 15 minutes of non-face-to-face time during this encounter.        Objective:   Physical Exam        Assessment & Plan:

## 2018-07-25 ENCOUNTER — Encounter: Payer: Self-pay | Admitting: Nurse Practitioner

## 2018-12-08 ENCOUNTER — Other Ambulatory Visit: Payer: Self-pay | Admitting: Obstetrics & Gynecology

## 2019-04-04 ENCOUNTER — Ambulatory Visit: Payer: BC Managed Care – PPO | Attending: Internal Medicine

## 2019-04-04 DIAGNOSIS — Z23 Encounter for immunization: Secondary | ICD-10-CM | POA: Insufficient documentation

## 2019-04-04 NOTE — Progress Notes (Signed)
   Covid-19 Vaccination Clinic  Name:  SHANDELL GIOVANNI    MRN: [id number] DOB: [birth year]  04/04/2019  Ms. Dukeman was observed post Covid-19 immunization for 15 minutes without incident. She was provided with Vaccine Information Sheet and instruction to access the V-Safe system.   Ms. Leedy was instructed to call 911 with any severe reactions post vaccine: Marland Kitchen Difficulty breathing  . Swelling of face and throat  . A fast heartbeat  . A bad rash all over body  . Dizziness and weakness   Immunizations Administered    Name Date Dose VIS Date Route   Pfizer COVID-19 Vaccine 04/04/2019  3:10 PM 0.3 mL 01/08/2019 Intramuscular   Manufacturer: [employer], [employer]   Lot: [id number]   NDC: [address]-9

## 2019-04-25 ENCOUNTER — Ambulatory Visit: Payer: BC Managed Care – PPO | Attending: Internal Medicine

## 2019-04-25 DIAGNOSIS — Z23 Encounter for immunization: Secondary | ICD-10-CM

## 2019-04-25 NOTE — Progress Notes (Signed)
   Covid-19 Vaccination Clinic  Name:  Jennifer Cobb    MRN: [id number] DOB: 05-22-1970  04/25/2019  [name] was observed post Covid-19 immunization for 15 minutes without incident. She was provided with Vaccine Information Sheet and instruction to access the V-Safe system.   [name] was instructed to call 911 with any severe reactions post vaccine: Marland Kitchen Difficulty breathing  . Swelling of face and throat  . A fast heartbeat  . A bad rash all over body  . Dizziness and weakness   Immunizations Administered    Name Date Dose VIS Date Route   Pfizer COVID-19 Vaccine 04/25/2019  1:35 PM 0.3 mL 01/08/2019 Intramuscular   Manufacturer: [employer], [employer]   Lot: [id number]   NDC: [address]-0

## 2019-12-10 ENCOUNTER — Other Ambulatory Visit: Payer: Self-pay | Admitting: Obstetrics & Gynecology

## 2020-02-25 ENCOUNTER — Encounter: Payer: Self-pay | Admitting: Family Medicine

## 2020-02-25 ENCOUNTER — Other Ambulatory Visit: Payer: Self-pay

## 2020-02-25 ENCOUNTER — Ambulatory Visit (INDEPENDENT_AMBULATORY_CARE_PROVIDER_SITE_OTHER): Payer: BC Managed Care – PPO | Admitting: Family Medicine

## 2020-02-25 DIAGNOSIS — R059 Cough, unspecified: Secondary | ICD-10-CM | POA: Diagnosis not present

## 2020-02-25 DIAGNOSIS — U071 COVID-19: Secondary | ICD-10-CM | POA: Diagnosis not present

## 2020-02-25 MED ORDER — AMOXICILLIN 500 MG PO CAPS: 500.0000 mg | ORAL_CAPSULE | Freq: Three times a day (TID) | ORAL | 0 refills | Status: DC

## 2020-02-25 NOTE — Progress Notes (Signed)
   Subjective:    Patient ID: Jennifer Cobb, female    DOB: 1970/05/13, 50 y.o.   MRN: [id number]  Fever  This is a new problem. The current episode started in the past 7 days. Associated symptoms include congestion, coughing, headaches and a sore throat.  Body aches fever headache not feeling good symptoms over the past few days no wheezing or difficulty breathing.  Energy level low.  Some intermittent coughing.  PMH benign No Covid test  Review of Systems  Constitutional: Positive for fever.  HENT: Positive for congestion and sore throat.   Respiratory: Positive for cough.   Neurological: Positive for headaches.       Objective:   Physical Exam Eardrums are normal on the left whereas the right side has some slight redness but no fluid throat is normal neck no masses lungs are clear heart regular HEENT benign       Assessment & Plan:  Viral syndrome Possible Covid Cannot rule out flu Covid test taken Supportive measures discussed Follow-up if progressive troubles or problems Amoxicillin sent in to be put on hold just in case the ear gets worse Work excuse given for next week Warning signs were discussed in detail  Suspected COVID  Covid infection This is a viral process.  Mild cases are treated with supportive measures at home such as Tylenol rest fluids.  In some situations monoclonal antibodies may be appropriate depending on the patient's risk criteria.  The patient was educated regarding progressive illness including respiratory, persistent vomiting, change in mental status.  If any of these occur ER evaluation is recommended. Patient was educated about the following as well Covid-19 respiratory warning: Covid-19 is a virus that causes hypoxia (low oxygen level in blood) in some people. If you develop any changes in your usual breathing pattern: difficulty catching your breath, more short winded with activity or with resting, or anything that concerns you about your  breathing, do not hesitate to go to the emergency department immediately for evaluation. Please do not delay to get treatment.   Agrees with plan of care discussed today. Understands warning signs to seek further care: Chest pain, shortness of breath, mental confusion, profuse vomiting, any significant change in health. Understands to follow-up if symptoms do not improve, or worsen.

## 2020-02-27 LAB — SARS-COV-2, NAA 2 DAY TAT

## 2020-02-27 LAB — NOVEL CORONAVIRUS, NAA: SARS-CoV-2, NAA: DETECTED — AB

## 2020-04-10 ENCOUNTER — Encounter: Payer: Self-pay | Admitting: Family Medicine

## 2020-04-10 ENCOUNTER — Ambulatory Visit (INDEPENDENT_AMBULATORY_CARE_PROVIDER_SITE_OTHER): Payer: BC Managed Care – PPO | Admitting: Family Medicine

## 2020-04-10 ENCOUNTER — Other Ambulatory Visit: Payer: Self-pay

## 2020-04-10 VITALS — HR 71 | Temp 98.6°F | Resp 18

## 2020-04-10 DIAGNOSIS — J069 Acute upper respiratory infection, unspecified: Secondary | ICD-10-CM | POA: Diagnosis not present

## 2020-04-10 MED ORDER — CHERATUSSIN AC 100-10 MG/5ML PO SOLN: 5.0000 mL | Freq: Three times a day (TID) | ORAL | 0 refills | Status: DC | PRN

## 2020-04-10 MED ORDER — ALBUTEROL SULFATE HFA 108 (90 BASE) MCG/ACT IN AERS: 2.0000 | INHALATION_SPRAY | Freq: Four times a day (QID) | RESPIRATORY_TRACT | 0 refills | Status: DC | PRN

## 2020-04-10 NOTE — Progress Notes (Signed)
Patient ID: Jennifer Cobb, female    DOB: 1970-05-06, 50 y.o.   MRN: [id number]   Chief Complaint  Patient presents with  . Cough    Congestion, sore throat and ear pain Started the Sunday before last- Covid tested on Tuesday and it was negative   Subjective:    HPI CC-Coughing, congestion, sore throat, and left ear pain. Has been taking amox for 3 days, not much relief.  Used albuterol neb 2-3 x last week. No h/o smoking. Tested negative covid on Tuesday last week neg with home test. Had both covid vaccines and booster.  Had covid in end of Jan'22.  Saw dr. Lorin Picket in Ja '22 for sinusitis and given amoxicillin. Has been around sick contact in a play that was sick. Pt not able to tolerate prednisone.   Medical History Larine has a past medical history of Anemia and Celiac disease.   Outpatient Encounter Medications as of 04/10/2020  Medication Sig  . albuterol (VENTOLIN HFA) 108 (90 Base) MCG/ACT inhaler Inhale 2 puffs into the lungs every 6 (six) hours as needed for wheezing or shortness of breath.  . guaiFENesin-codeine (CHERATUSSIN AC) 100-10 MG/5ML syrup Take 5 mLs by mouth 3 (three) times daily as needed for cough. Caution with driving, causes sedation.  Marland Kitchen albuterol (PROVENTIL) (2.5 MG/3ML) 0.083% nebulizer solution Take 3 mLs (2.5 mg total) by nebulization every 6 (six) hours as needed for wheezing.  Marland Kitchen amoxicillin (AMOXIL) 500 MG capsule Take 1 capsule (500 mg total) by mouth 3 (three) times daily. (Patient not taking: Reported on 04/10/2020)  . chlorzoxazone (PARAFON FORTE DSC) 500 MG tablet Take 1 tablet (500 mg total) by mouth 4 (four) times daily as needed for muscle spasms.  . diclofenac (VOLTAREN) 75 MG EC tablet Take 1 tablet (75 mg total) by mouth 2 (two) times daily.  . ferrous sulfate 325 (65 FE) MG tablet Take 325 mg by mouth daily with breakfast.  . sertraline (ZOLOFT) 50 MG tablet TAKE ONE (1) TABLET BY MOUTH EVERY DAY  . triamcinolone cream (KENALOG) 0.1 %  Apply a thin layer to affected area twice daily as needed.   No facility-administered encounter medications on file as of 04/10/2020.     Review of Systems  Constitutional: Negative for chills and fever.  HENT: Positive for congestion, ear pain and sore throat. Negative for rhinorrhea.   Respiratory: Positive for cough. Negative for shortness of breath and wheezing.   Cardiovascular: Negative for chest pain and leg swelling.  Gastrointestinal: Negative for abdominal pain, diarrhea, nausea and vomiting.  Genitourinary: Negative for dysuria and frequency.  Musculoskeletal: Negative for arthralgias and back pain.  Skin: Negative for rash.  Neurological: Negative for dizziness, weakness and headaches.     Vitals Pulse 71   Temp 98.6 F (37 C) (Temporal)   Resp 18   SpO2 97%   Objective:   Physical Exam Vitals and nursing note reviewed.  Constitutional:      General: She is not in acute distress.    Appearance: Normal appearance. She is not ill-appearing or toxic-appearing.  HENT:     Head: Normocephalic and atraumatic.     Right Ear: Tympanic membrane, ear canal and external ear normal.     Left Ear: Tympanic membrane, ear canal and external ear normal.     Nose: Nose normal. No congestion or rhinorrhea.     Mouth/Throat:     Mouth: Mucous membranes are moist.     Pharynx: Oropharynx is clear. No oropharyngeal  exudate or posterior oropharyngeal erythema.  Eyes:     Extraocular Movements: Extraocular movements intact.     Conjunctiva/sclera: Conjunctivae normal.     Pupils: Pupils are equal, round, and reactive to light.  Cardiovascular:     Rate and Rhythm: Normal rate and regular rhythm.     Pulses: Normal pulses.     Heart sounds: Normal heart sounds. No murmur heard.   Pulmonary:     Effort: Pulmonary effort is normal. No respiratory distress.     Breath sounds: Normal breath sounds. No wheezing, rhonchi or rales.  Musculoskeletal:     Cervical back: Normal range  of motion.  Lymphadenopathy:     Cervical: No cervical adenopathy.  Skin:    General: Skin is warm and dry.     Findings: No rash.  Neurological:     Mental Status: She is alert and oriented to person, place, and time.  Psychiatric:        Mood and Affect: Mood normal.        Behavior: Behavior normal.    Assessment and Plan   1. Viral URI with cough - albuterol (VENTOLIN HFA) 108 (90 Base) MCG/ACT inhaler; Inhale 2 puffs into the lungs every 6 (six) hours as needed for wheezing or shortness of breath.  Dispense: 18 g; Refill: 0 - guaiFENesin-codeine (CHERATUSSIN AC) 100-10 MG/5ML syrup; Take 5 mLs by mouth 3 (three) times daily as needed for cough. Caution with driving, causes sedation.  Dispense: 120 mL; Refill: 0   Likely viral uri- Pt to take mucinex or robitussin during day, increase fluids.  Tylenol or ibuprofen for pain. flonase for congestion. Albuterol for coughing.  Pt not able to tolerate the prednisone, will hold for now. Gave cheratussin for coughing prn. call or rto in 2-3 days if not improving.  Return if symptoms worsen or fail to improve.

## 2020-04-11 ENCOUNTER — Ambulatory Visit: Payer: BC Managed Care – PPO | Admitting: Family Medicine

## 2020-07-11 ENCOUNTER — Other Ambulatory Visit: Payer: Self-pay | Admitting: Family Medicine

## 2020-07-11 ENCOUNTER — Telehealth: Payer: Self-pay

## 2020-07-11 MED ORDER — SULFACETAMIDE SODIUM 10 % OP SOLN: OPHTHALMIC | 0 refills | Status: DC

## 2020-07-11 NOTE — Telephone Encounter (Signed)
Pt contacted. Pt states she is having watery discharge and redness. Has been using OTC allergy meds with no relief. Did have URI last week. COVID test negative. No pain, no fever. Pt would like eye drops sent to [facility] due to prior obligations this afternoon. Please advise. Thank you

## 2020-07-11 NOTE — Telephone Encounter (Signed)
Medications were sent as requested follow-up if any problems may send her a MyChart message thank you

## 2020-07-11 NOTE — Telephone Encounter (Signed)
Patient notified and verbalized understanding. 

## 2020-07-11 NOTE — Telephone Encounter (Signed)
Woke up yesterday with pink eye, sticky discharge in one eye and red.  No available appts this week.  Can we all in drops?   [facility]

## 2020-07-11 NOTE — Telephone Encounter (Signed)
Nurses Please talk with patient It does sound that this appears to be conjunctivitis  Please confirm the symptoms(typical conjunctivitis would be watery mucousy discharge with some redness if she is having severe pain or fever or atypical symptoms I would want to work her in otherwise I would feel comfortable with calling it drops Most conjunctivitis is arms started with viral but can have secondary components associated with secondary infection Antibiotic drops can help shorten the length of the problem  Please talk with patient regarding her symptoms-if atypical symptoms we could do a brief visit today at 430 she could come at 420 and be worked in before closing If classic symptoms I can send in drops if she is interested

## 2020-08-08 DIAGNOSIS — S52501A Unspecified fracture of the lower end of right radius, initial encounter for closed fracture: Secondary | ICD-10-CM | POA: Insufficient documentation

## 2020-08-10 ENCOUNTER — Encounter (HOSPITAL_COMMUNITY): Payer: Self-pay | Admitting: Orthopedic Surgery

## 2020-08-10 NOTE — Progress Notes (Signed)
Spoke with pt for pre-op call. Pt denies cardiac history, HTN or Diabetes.   Pt's surgery is scheduled as ambulatory so no Covid test is required prior to surgery. Pt denies any recent Covid symptoms.

## 2020-08-10 NOTE — H&P (Addendum)
Jennifer Cobb is an 50 y.o. female.   Chief Complaint: RIGHT WRIST INJURY  HPI: The patient is a 50y/o right hand dominant female who fell off of a ladder at work on 07/24/20. She had immediate swelling and pain. She was seen initially and treated with a sugar tong splint.  She was seen in our office for further evaluation. She continues to have pain, numbness, swelling, and stiffness. Discussed the reason and rationale for surgical intervention. She has been using a wrist brace.  She is here today for surgery.  She denies chest pain, shortness of breath, fever, chills, nausea, vomiting, or diarrhea.    Past Medical History:  Diagnosis Date   Anemia    Celiac disease     Past Surgical History:  Procedure Laterality Date   EYE SURGERY Left     Family History  Problem Relation Age of Onset   Cancer Mother 76       colon   Hypertension Father    Autism Son    Syncope episode Daughter    Social History:  reports that she has never smoked. She has never used smokeless tobacco. She reports that she does not drink alcohol and does not use drugs.  Allergies:  Allergies  Allergen Reactions   Gluten Meal Diarrhea and Nausea And Vomiting    No medications prior to admission.    No results found for this or any previous visit (from the past 48 hour(s)). No results found.  ROS NO RECENT ILLNESSES OR HOSPITALIZATIONS  There were no vitals taken for this visit. Physical Exam  General Appearance:  Alert, cooperative, no distress, appears stated age  Head:  Normocephalic, without obvious abnormality, atraumatic  Eyes:  Pupils equal, conjunctiva/corneas clear,         Throat: Lips, mucosa, and tongue normal; teeth and gums normal  Neck: No visible masses     Lungs:   respirations unlabored  Chest Wall:  No tenderness or deformity  Heart:  Regular rate and rhythm,  Abdomen:   Soft, non-tender,         Extremities: RUE - MODERATE SWELLING WITHOUT ERYTHEMA OR OPEN WOUNDS.  SENSATION INTACT TO LIGHT TOUCH DISTALLY. CAPILLARY REFILL LESS THAN 2 SECONDS. LIMITED MOTION OF THE WRIST IN ALL DIRECTIONS. ABLE TO GENTLY WIGGLE ALL DIGITS. TENDERNESS TO PALPATION OF THE DISTAL RADIUS.   Pulses: 2+ and symmetric  Skin: Skin color, texture, turgor normal, no rashes or lesions     Neurologic: Normal     Assessment/Plan RIGHT DISTAL RADIUS FRACTURE     - RIGHT DISTAL RADIUS OPEN REDUCTION AND INTERNAL FIXATION WITH REPAIR AS INDICATED   WE ARE PLANNING SURGERY FOR YOUR UPPER EXTREMITY. THE RISKS AND BENEFITS OF SURGERY INCLUDE BUT NOT LIMITED TO BLEEDING INFECTION, DAMAGE TO NEARBY NERVES ARTERIES TENDONS, FAILURE OF SURGERY TO ACCOMPLISH ITS INTENDED GOALS, PERSISTENT SYMPTOMS AND NEED FOR FURTHER SURGICAL INTERVENTION. WITH THIS IN MIND WE WILL PROCEED. I HAVE DISCUSSED WITH THE PATIENT THE PRE AND POSTOPERATIVE REGIMEN AND THE DOS AND DON'TS. PT VOICED UNDERSTANDING AND INFORMED CONSENT SIGNED.   R/B/A DISCUSSED WITH PT IN OFFICE.  PT VOICED UNDERSTANDING OF PLAN CONSENT SIGNED DAY OF SURGERY PT SEEN AND EXAMINED PRIOR TO OPERATIVE PROCEDURE/DAY OF SURGERY SITE MARKED. QUESTIONS ANSWERED WILL GO HOME FOLLOWING SURGERY   Guilianna Mckoy [facility] MD  Karma Greaser 08/10/2020, 12:10 PM   R/B/A DISCUSSED WITH PT IN OFFICE.  PT VOICED UNDERSTANDING OF PLAN CONSENT SIGNED DAY OF SURGERY PT SEEN AND  EXAMINED PRIOR TO OPERATIVE PROCEDURE/DAY OF SURGERY SITE MARKED. QUESTIONS ANSWERED WILL GO HOME FOLLOWING SURGERY   Bradly Bienenstock md 08/12/20

## 2020-08-12 ENCOUNTER — Ambulatory Visit (HOSPITAL_COMMUNITY)
Admission: RE | Admit: 2020-08-12 | Discharge: 2020-08-12 | Disposition: A | Discharge status: Home / Self Care | Payer: No Typology Code available for payment source | Attending: Orthopedic Surgery | Admitting: Orthopedic Surgery

## 2020-08-12 ENCOUNTER — Encounter (HOSPITAL_COMMUNITY): Payer: Self-pay | Admitting: Orthopedic Surgery

## 2020-08-12 ENCOUNTER — Ambulatory Visit (HOSPITAL_COMMUNITY): Payer: No Typology Code available for payment source

## 2020-08-12 ENCOUNTER — Other Ambulatory Visit: Payer: Self-pay

## 2020-08-12 ENCOUNTER — Ambulatory Visit (HOSPITAL_COMMUNITY): Payer: No Typology Code available for payment source | Admitting: Certified Registered"

## 2020-08-12 ENCOUNTER — Encounter (HOSPITAL_COMMUNITY)
Admission: RE | Disposition: A | Discharge status: Home / Self Care | Payer: Self-pay | Source: Home / Self Care | Attending: Orthopedic Surgery

## 2020-08-12 DIAGNOSIS — W11XXXA Fall on and from ladder, initial encounter: Secondary | ICD-10-CM | POA: Insufficient documentation

## 2020-08-12 DIAGNOSIS — S52571A Other intraarticular fracture of lower end of right radius, initial encounter for closed fracture: Secondary | ICD-10-CM | POA: Insufficient documentation

## 2020-08-12 DIAGNOSIS — S52501A Unspecified fracture of the lower end of right radius, initial encounter for closed fracture: Secondary | ICD-10-CM

## 2020-08-12 HISTORY — DX: Unspecified osteoarthritis, unspecified site: M19.90

## 2020-08-12 HISTORY — PX: ORIF WRIST FRACTURE: SHX2133

## 2020-08-12 LAB — CBC
HCT: 44.7 % (ref 36.0–46.0)
Hemoglobin: 14.5 g/dL (ref 12.0–15.0)
MCH: 27.9 pg (ref 26.0–34.0)
MCHC: 32.4 g/dL (ref 30.0–36.0)
MCV: 86.1 fL (ref 80.0–100.0)
Platelets: 385 10*3/uL (ref 150–400)
RBC: 5.19 MIL/uL — ABNORMAL HIGH (ref 3.87–5.11)
RDW: 13.2 % (ref 11.5–15.5)
WBC: 8.2 10*3/uL (ref 4.0–10.5)
nRBC: 0 % (ref 0.0–0.2)

## 2020-08-12 SURGERY — OPEN REDUCTION INTERNAL FIXATION (ORIF) WRIST FRACTURE
Anesthesia: Monitor Anesthesia Care | Site: Wrist | Laterality: Right

## 2020-08-12 MED ORDER — FENTANYL CITRATE (PF) 250 MCG/5ML IJ SOLN
INTRAMUSCULAR | Status: AC
  Filled 2020-08-12: qty 5

## 2020-08-12 MED ORDER — MIDAZOLAM HCL 2 MG/2ML IJ SOLN: 2.0000 mg | Freq: Once | INTRAMUSCULAR | Status: AC

## 2020-08-12 MED ORDER — PROPOFOL 500 MG/50ML IV EMUL
INTRAVENOUS | Status: DC | PRN
  Administered 2020-08-12: 75 ug/kg/min via INTRAVENOUS

## 2020-08-12 MED ORDER — METHOCARBAMOL 500 MG PO TABS: 500.0000 mg | ORAL_TABLET | Freq: Three times a day (TID) | ORAL | 0 refills | Status: DC | PRN

## 2020-08-12 MED ORDER — FENTANYL CITRATE (PF) 100 MCG/2ML IJ SOLN: 50.0000 ug | Freq: Once | INTRAMUSCULAR | Status: AC

## 2020-08-12 MED ORDER — ORAL CARE MOUTH RINSE: 15.0000 mL | Freq: Once | OROMUCOSAL | Status: AC

## 2020-08-12 MED ORDER — ACETAMINOPHEN 325 MG PO TABS: 325.0000 mg | ORAL_TABLET | ORAL | Status: DC | PRN

## 2020-08-12 MED ORDER — BUPIVACAINE-EPINEPHRINE (PF) 0.5% -1:200000 IJ SOLN
INTRAMUSCULAR | Status: DC | PRN
  Administered 2020-08-12: 30 mL via PERINEURAL

## 2020-08-12 MED ORDER — LACTATED RINGERS IV SOLN: INTRAVENOUS | Status: DC

## 2020-08-12 MED ORDER — CHLORHEXIDINE GLUCONATE 0.12 % MT SOLN: 15.0000 mL | Freq: Once | OROMUCOSAL | Status: AC

## 2020-08-12 MED ORDER — OXYCODONE HCL 5 MG PO TABS: 5.0000 mg | ORAL_TABLET | Freq: Once | ORAL | Status: DC | PRN

## 2020-08-12 MED ORDER — MIDAZOLAM HCL 2 MG/2ML IJ SOLN
INTRAMUSCULAR | Status: AC
  Administered 2020-08-12: 2 mg via INTRAVENOUS
  Filled 2020-08-12: qty 2

## 2020-08-12 MED ORDER — FENTANYL CITRATE (PF) 100 MCG/2ML IJ SOLN: 25.0000 ug | INTRAMUSCULAR | Status: DC | PRN

## 2020-08-12 MED ORDER — ACETAMINOPHEN 10 MG/ML IV SOLN: 1000.0000 mg | Freq: Once | INTRAVENOUS | Status: DC | PRN

## 2020-08-12 MED ORDER — HYDROCODONE-ACETAMINOPHEN 5-325 MG PO TABS: 1.0000 | ORAL_TABLET | ORAL | 0 refills | Status: AC | PRN

## 2020-08-12 MED ORDER — CHLORHEXIDINE GLUCONATE 0.12 % MT SOLN
OROMUCOSAL | Status: AC
  Administered 2020-08-12: 15 mL via OROMUCOSAL
  Filled 2020-08-12: qty 15

## 2020-08-12 MED ORDER — AMISULPRIDE (ANTIEMETIC) 5 MG/2ML IV SOLN: 10.0000 mg | Freq: Once | INTRAVENOUS | Status: DC | PRN

## 2020-08-12 MED ORDER — 0.9 % SODIUM CHLORIDE (POUR BTL) OPTIME
TOPICAL | Status: DC | PRN
  Administered 2020-08-12: 1000 mL

## 2020-08-12 MED ORDER — DOCUSATE SODIUM 100 MG PO CAPS: 100.0000 mg | ORAL_CAPSULE | Freq: Two times a day (BID) | ORAL | 0 refills | Status: DC

## 2020-08-12 MED ORDER — PROPOFOL 10 MG/ML IV BOLUS
INTRAVENOUS | Status: DC | PRN
  Administered 2020-08-12: 20 mg via INTRAVENOUS

## 2020-08-12 MED ORDER — PROMETHAZINE HCL 25 MG/ML IJ SOLN: 6.2500 mg | INTRAMUSCULAR | Status: DC | PRN

## 2020-08-12 MED ORDER — LIDOCAINE 2% (20 MG/ML) 5 ML SYRINGE
INTRAMUSCULAR | Status: DC | PRN
  Administered 2020-08-12: 40 mg via INTRAVENOUS

## 2020-08-12 MED ORDER — ACETAMINOPHEN 160 MG/5ML PO SOLN: 325.0000 mg | ORAL | Status: DC | PRN

## 2020-08-12 MED ORDER — CEFAZOLIN SODIUM-DEXTROSE 2-4 GM/100ML-% IV SOLN
2.0000 g | INTRAVENOUS | Status: AC
  Administered 2020-08-12: 2 g via INTRAVENOUS
  Filled 2020-08-12: qty 100

## 2020-08-12 MED ORDER — FENTANYL CITRATE (PF) 100 MCG/2ML IJ SOLN
INTRAMUSCULAR | Status: AC
  Administered 2020-08-12: 50 ug via INTRAVENOUS
  Filled 2020-08-12: qty 2

## 2020-08-12 MED ORDER — OXYCODONE HCL 5 MG/5ML PO SOLN: 5.0000 mg | Freq: Once | ORAL | Status: DC | PRN

## 2020-08-12 SURGICAL SUPPLY — 66 items
BAG COUNTER SPONGE SURGICOUNT (BAG) ×2 IMPLANT
BIT DRILL 2.2 SS TIBIAL (BIT) ×2 IMPLANT
BLADE CLIPPER SURG (BLADE) IMPLANT
BNDG ELASTIC 3X5.8 VLCR STR LF (GAUZE/BANDAGES/DRESSINGS) ×2 IMPLANT
BNDG ELASTIC 4X5.8 VLCR STR LF (GAUZE/BANDAGES/DRESSINGS) ×2 IMPLANT
BNDG ESMARK 4X9 LF (GAUZE/BANDAGES/DRESSINGS) ×2 IMPLANT
BNDG GAUZE ELAST 4 BULKY (GAUZE/BANDAGES/DRESSINGS) ×2 IMPLANT
CORD BIPOLAR FORCEPS 12FT (ELECTRODE) ×2 IMPLANT
COVER SURGICAL LIGHT HANDLE (MISCELLANEOUS) ×2 IMPLANT
CUFF TOURN SGL QUICK 18X4 (TOURNIQUET CUFF) ×2 IMPLANT
CUFF TOURN SGL QUICK 24 (TOURNIQUET CUFF)
CUFF TRNQT CYL 24X4X16.5-23 (TOURNIQUET CUFF) IMPLANT
DRAIN TLS ROUND 10FR (DRAIN) IMPLANT
DRAPE OEC MINIVIEW 54X84 (DRAPES) ×2 IMPLANT
DRAPE SURG 17X11 SM STRL (DRAPES) ×2 IMPLANT
DRAPE SURG 17X23 STRL (DRAPES) ×2 IMPLANT
DRSG ADAPTIC 3X8 NADH LF (GAUZE/BANDAGES/DRESSINGS) ×2 IMPLANT
ELECT REM PT RETURN 9FT ADLT (ELECTROSURGICAL)
ELECTRODE REM PT RTRN 9FT ADLT (ELECTROSURGICAL) IMPLANT
GAUZE 4X4 16PLY ~~LOC~~+RFID DBL (SPONGE) ×2 IMPLANT
GAUZE SPONGE 4X4 12PLY STRL (GAUZE/BANDAGES/DRESSINGS) ×2 IMPLANT
GLOVE SURG ORTHO LTX SZ8 (GLOVE) ×2 IMPLANT
GLOVE SURG UNDER POLY LF SZ8.5 (GLOVE) ×2 IMPLANT
GOWN STRL REUS W/ TWL LRG LVL3 (GOWN DISPOSABLE) ×3 IMPLANT
GOWN STRL REUS W/ TWL XL LVL3 (GOWN DISPOSABLE) ×1 IMPLANT
GOWN STRL REUS W/TWL LRG LVL3 (GOWN DISPOSABLE) ×3
GOWN STRL REUS W/TWL XL LVL3 (GOWN DISPOSABLE) ×1
K-WIRE 1.6 (WIRE) ×1
K-WIRE FX5X1.6XNS BN SS (WIRE) ×1
KIT BASIN OR (CUSTOM PROCEDURE TRAY) ×2 IMPLANT
KIT TURNOVER KIT B (KITS) ×2 IMPLANT
KWIRE FX5X1.6XNS BN SS (WIRE) ×1 IMPLANT
MANIFOLD NEPTUNE II (INSTRUMENTS) ×2 IMPLANT
NEEDLE HYPO 25X1 1.5 SAFETY (NEEDLE) ×2 IMPLANT
NS IRRIG 1000ML POUR BTL (IV SOLUTION) ×2 IMPLANT
PACK ORTHO EXTREMITY (CUSTOM PROCEDURE TRAY) ×2 IMPLANT
PAD ARMBOARD 7.5X6 YLW CONV (MISCELLANEOUS) ×4 IMPLANT
PAD CAST 4YDX4 CTTN HI CHSV (CAST SUPPLIES) ×1 IMPLANT
PADDING CAST ABS 4INX4YD NS (CAST SUPPLIES) ×1
PADDING CAST ABS COTTON 4X4 ST (CAST SUPPLIES) ×1 IMPLANT
PADDING CAST COTTON 4X4 STRL (CAST SUPPLIES) ×1
PEG LOCKING SMOOTH 2.2X16 (Screw) ×2 IMPLANT
PEG LOCKING SMOOTH 2.2X18 (Peg) ×6 IMPLANT
PEG LOCKING SMOOTH 2.2X20 (Screw) ×4 IMPLANT
PLATE DVR CROSSLOCK STD RT (Plate) ×2 IMPLANT
SCREW LOCK 12X2.7X 3 LD (Screw) ×2 IMPLANT
SCREW LOCK 14X2.7X 3 LD TPR (Screw) ×3 IMPLANT
SCREW LOCK 16X2.7X 3 LD TPR (Screw) ×1 IMPLANT
SCREW LOCK 18X2.7X 3 LD TPR (Screw) ×1 IMPLANT
SCREW LOCKING 2.7X12MM (Screw) ×2 IMPLANT
SCREW LOCKING 2.7X14 (Screw) ×6 IMPLANT
SCREW LOCKING 2.7X16 (Screw) ×2 IMPLANT
SCREW LOCKING 2.7X18 (Screw) ×2 IMPLANT
SOAP 2 % CHG 4 OZ (WOUND CARE) ×2 IMPLANT
SPLINT FIBERGLASS 3X12 (CAST SUPPLIES) ×2 IMPLANT
SPONGE T-LAP 18X18 ~~LOC~~+RFID (SPONGE) ×2 IMPLANT
SUT MNCRL AB 3-0 PS2 27 (SUTURE) ×2 IMPLANT
SUT PROLENE 4 0 PS 2 18 (SUTURE) ×4 IMPLANT
SUT VIC AB 2-0 FS1 27 (SUTURE) ×2 IMPLANT
SUT VICRYL 4-0 PS2 18IN ABS (SUTURE) IMPLANT
SYR CONTROL 10ML LL (SYRINGE) IMPLANT
SYSTEM CHEST DRAIN TLS 7FR (DRAIN) IMPLANT
TOWEL GREEN STERILE (TOWEL DISPOSABLE) ×2 IMPLANT
TOWEL GREEN STERILE FF (TOWEL DISPOSABLE) ×2 IMPLANT
TUBE CONNECTING 12X1/4 (SUCTIONS) ×4 IMPLANT
WATER STERILE IRR 1000ML POUR (IV SOLUTION) ×2 IMPLANT

## 2020-08-12 NOTE — Transfer of Care (Signed)
Immediate Anesthesia Transfer of Care Note  Patient: Jennifer Cobb  Procedure(s) Performed: Right distal radius open reduction and internal fixation with repair as indicated (Right: Wrist)  Patient Location: PACU  Anesthesia Type:MAC combined with regional for post-op pain  Level of Consciousness: awake and patient cooperative  Airway & Oxygen Therapy: Patient Spontanous Breathing and Patient connected to nasal cannula oxygen  Post-op Assessment: Report given to RN, Post -op Vital signs reviewed and stable and Patient moving all extremities  Post vital signs: Reviewed and stable  Last Vitals:  Vitals Value Taken Time  BP 110/64 08/12/20 1026  Temp    Pulse 58 08/12/20 1026  Resp 15 08/12/20 1026  SpO2 100 % 08/12/20 1026  Vitals shown include unvalidated device data.  Last Pain:  Vitals:   08/12/20 0830  TempSrc:   PainSc: 0-No pain      Patients Stated Pain Goal: 3 (15/40/08 6761)  Complications: No notable events documented.

## 2020-08-12 NOTE — Anesthesia Postprocedure Evaluation (Signed)
Anesthesia Post Note  Patient: Jennifer Cobb  Procedure(s) Performed: Right distal radius open reduction and internal fixation with repair as indicated (Right: Wrist)     Patient location during evaluation: PACU Anesthesia Type: Regional Level of consciousness: awake and alert Pain management: pain level controlled Vital Signs Assessment: post-procedure vital signs reviewed and stable Respiratory status: spontaneous breathing, nonlabored ventilation, respiratory function stable and patient connected to nasal cannula oxygen Cardiovascular status: stable and blood pressure returned to baseline Postop Assessment: no apparent nausea or vomiting Anesthetic complications: no   No notable events documented.  Last Vitals:  Vitals:   08/12/20 1045 08/12/20 1100  BP: 124/84 118/79  Pulse: 67 (!) 54  Resp: 12 16  Temp: 36.6 C   SpO2: 99% 100%    Last Pain:  Vitals:   08/12/20 1100  TempSrc:   PainSc: 0-No pain                 Shelton Silvas

## 2020-08-12 NOTE — Anesthesia Preprocedure Evaluation (Signed)
Anesthesia Evaluation  Patient identified by MRN, date of birth, ID band Patient awake    Reviewed: Allergy & Precautions, NPO status , Patient's Chart, lab work & pertinent test results  Airway Mallampati: I  TM Distance: >3 FB Neck ROM: Full    Dental  (+) Teeth Intact, Dental Advisory Given   Pulmonary neg pulmonary ROS,    breath sounds clear to auscultation       Cardiovascular negative cardio ROS   Rhythm:Regular Rate:Bradycardia     Neuro/Psych negative neurological ROS     GI/Hepatic negative GI ROS, Neg liver ROS,   Endo/Other  negative endocrine ROS  Renal/GU negative Renal ROS     Musculoskeletal  (+) Arthritis ,   Abdominal   Peds  Hematology negative hematology ROS (+)   Anesthesia Other Findings   Reproductive/Obstetrics                             Anesthesia Physical Anesthesia Plan  ASA: 2  Anesthesia Plan: Regional and MAC   Post-op Pain Management:    Induction: Intravenous  PONV Risk Score and Plan: 3 and Ondansetron, Propofol infusion and Midazolam  Airway Management Planned: Natural Airway and Simple Face Mask  Additional Equipment: None  Intra-op Plan:   Post-operative Plan: Extubation in OR  Informed Consent: I have reviewed the patients History and Physical, chart, labs and discussed the procedure including the risks, benefits and alternatives for the proposed anesthesia with the patient or authorized representative who has indicated his/her understanding and acceptance.     Dental advisory given  Plan Discussed with: CRNA  Anesthesia Plan Comments:         Anesthesia Quick Evaluation

## 2020-08-12 NOTE — Anesthesia Procedure Notes (Signed)
Procedure Name: MAC Date/Time: 08/12/2020 9:30 AM Performed by: Moshe Salisbury, CRNA Pre-anesthesia Checklist: Patient identified, Emergency Drugs available, Suction available and Patient being monitored Patient Re-evaluated:Patient Re-evaluated prior to induction Placement Confirmation: positive ETCO2 Dental Injury: Teeth and Oropharynx as per pre-operative assessment

## 2020-08-12 NOTE — Anesthesia Procedure Notes (Signed)
Anesthesia Regional Block: Supraclavicular block   Pre-Anesthetic Checklist: , timeout performed,  Correct Patient, Correct Site, Correct Laterality,  Correct Procedure, Correct Position, site marked,  Risks and benefits discussed,  Surgical consent,  Pre-op evaluation,  At surgeon's request and post-op pain management  Laterality: Right  Prep: chloraprep       Needles:  Injection technique: Single-shot  Needle Type: Echogenic Stimulator Needle     Needle Length: 9cm  Needle Gauge: 21     Additional Needles:   Procedures:,,,, ultrasound used (permanent image in chart),,    Narrative:  Start time: 08/12/2020 8:00 AM End time: 08/12/2020 8:05 AM Injection made incrementally with aspirations every 5 mL.  Performed by: Personally  Anesthesiologist: Shelton Silvas, MD  Additional Notes: Patient tolerated the procedure well. Local anesthetic introduced in an incremental fashion under minimal resistance after negative aspirations. No paresthesias were elicited. After completion of the procedure, no acute issues were identified and patient continued to be monitored by RN.

## 2020-08-12 NOTE — Op Note (Signed)
PREOPERATIVE DIAGNOSIS: Right wrist intra-articular distal radius fracture 3 more fragments  POSTOPERATIVE DIAGNOSIS: Same  ATTENDING SURGEON: Dr. Bradly Bienenstock who scrubbed and present for the entire procedure  ASSISTANT SURGEON: None  ANESTHESIA: Regional with IV sedation  OPERATIVE PROCEDURE: Open treatment of right wrist intra-articular distal radius fracture 3 more fragments Right wrist brachioradialis tendon release, tendon tenotomy Radiographs 3 views right wrist  IMPLANTS: Biomet standard DVR cross lock  EBL: Minimal  RADIOGRAPHIC INTERPRETATION: AP lateral oblique views of the wrist do show the volar plate fixation in place there is good alignment in all planes  SURGICAL INDICATIONS: Patient is a right-hand-dominant female who sustained a closed injury to her right distal radius.  Patient was seen evaluate in the office and recommended undergo the above procedure.  The risks of surgery include but not limited to bleeding infection damage nearby nerves arteries or tendons loss of motion of the wrist and digits incomplete relief of symptoms and need for further surgical invention.  Signed informed consent was obtained on the day of surgery.  SURGICAL TECHNIQUE: Patient was palpated via the preoperative holding area marked apart a marker made on the right wrist indicate correct operative site.  Patient brought back operating placed supine on the anesthesia table where the regional anesthetic was administered.  Patient tolerated this well.  Well-padded Tourniquet was then placed on the right brachium and stay with the appropriate drape.  The right upper extremities then prepped and draped in normal sterile fashion.  A timeout was called the correct site identified procedure then begun.  Attention was then turned to the right wrist.  The limb was then elevated using Esmarch segmentation the tourniquet insufflated.  Longitudinal incision made directly over the FCR sheath.  Dissection carried  down through the skin and subcutaneous tissue.  The FCR sheath was opened proximally distally.  The FPL was swept all the way and the pronator quadratus was then elevated in an L-shaped fashion.  The fracture site was then exposed.  Takedown of the early fracture callus was then done.  This was an intra-articular fracture of 3 more fragments.  In order to reduce the radial column the brachial radialis was then carefully released and tendon tenotomy and release was then done off the radial styloid.  The proximal segment of the fracture was then pronated allowing for recreation of the fracture site and takedown of the dorsal periosteal bone.  Once takedown of the very early nascent malunion was done attention was then turned to internal fixation.  The volar plate was then applied.  Open reduction of the intra-articular fracture was then carried out.  It was held distally with a K wire.  Plate position was then confirmed using the mini C arm.  The oblong screw hole was then placed proximally.  After placement of the oblong screw hole distal fixation was then carried out with a combination of distal locking pegs and screws from an ulnar to radial direction.  The positions were then confirmed using the mini C arm looking specifically down the joint.  After distal fixation was achieved proximal fixation was carried out with several more locking and nonlocking screws.  The wound was then thoroughly irrigated.  Final radiographs were then obtained.  The pronator quadratus was then closed with 2-0 Vicryl.  The subcutaneous tissues closed with 3-0 Monocryl and the skin was then closed with simple 4-0 Prolene.  Adaptic dressing a sterile compressive bandage then applied.  The patient was placed in a well-padded volar  splint taken recovery in good condition.  POSTOPERATIVE PLAN: Patient be discharged to home.  See him back in the office in 2 weeks for wound check suture removal x-rays application of a short arm cast.  Placed  the therapy order the first postoperative visit begin the therapy regimen 4 weeks.  Radiographs at each visit.

## 2020-08-12 NOTE — Discharge Instructions (Signed)
KEEP BANDAGE CLEAN AND DRY °CALL OFFICE FOR F/U APPT [phone] °KEEP HAND ELEVATED ABOVE HEART °OK TO APPLY ICE TO OPERATIVE AREA °CONTACT OFFICE IF ANY WORSENING PAIN OR CONCERNS. °

## 2020-08-15 ENCOUNTER — Encounter (HOSPITAL_COMMUNITY): Payer: Self-pay | Admitting: Orthopedic Surgery

## 2020-11-18 ENCOUNTER — Encounter: Payer: Self-pay | Admitting: Family Medicine

## 2020-11-20 ENCOUNTER — Other Ambulatory Visit: Payer: Self-pay | Admitting: Family Medicine

## 2020-11-20 MED ORDER — TRIAMCINOLONE ACETONIDE 0.1 % EX CREA: TOPICAL_CREAM | CUTANEOUS | 2 refills | Status: DC

## 2020-11-20 NOTE — Telephone Encounter (Signed)
A refill of this medicine was sent to [facility] pharmacy if any ongoing troubles let us now and follow-up for further evaluation

## 2020-12-07 ENCOUNTER — Other Ambulatory Visit: Payer: Self-pay | Admitting: Obstetrics & Gynecology

## 2020-12-07 ENCOUNTER — Ambulatory Visit (INDEPENDENT_AMBULATORY_CARE_PROVIDER_SITE_OTHER): Payer: BC Managed Care – PPO | Admitting: Family Medicine

## 2020-12-07 ENCOUNTER — Encounter: Payer: Self-pay | Admitting: Family Medicine

## 2020-12-07 ENCOUNTER — Other Ambulatory Visit: Payer: Self-pay

## 2020-12-07 VITALS — Temp 97.3°F | Wt 187.8 lb

## 2020-12-07 DIAGNOSIS — J029 Acute pharyngitis, unspecified: Secondary | ICD-10-CM

## 2020-12-07 LAB — POCT RAPID STREP A (OFFICE): Rapid Strep A Screen: NEGATIVE

## 2020-12-07 NOTE — Progress Notes (Signed)
   Subjective:    Patient ID: Jennifer Cobb, female    DOB: [birth year], 50 y.o.   MRN: [id number]  HPI Pt having headache, sore throat and fatigue. Symptoms started Tuesday (12/05/20). No issues breathing. At home COVID test negative.  Patient relates sore throat fatigue tiredness headache states just does not feel good denies wheezing or difficulty breathing no vomiting or diarrhea.  No runny nose.  Review of Systems     Objective:   Physical Exam Throat minimal erythema on one side no exudate is noted no lymphadenopathy lungs are clear no crackles heart regular eardrums normal  Patient denies urinary symptoms     Assessment & Plan:  Triple swab taken Rapid strep negative Viral syndrome Rest up is recommended Hopefully will return to school on Monday If not dramatically better by Monday touch base may need further work-up including blood work Follow-up sooner if worse

## 2020-12-08 LAB — COVID-19, FLU A+B AND RSV
Influenza A, NAA: NOT DETECTED
Influenza B, NAA: NOT DETECTED
RSV, NAA: NOT DETECTED
SARS-CoV-2, NAA: NOT DETECTED

## 2020-12-08 LAB — SPECIMEN STATUS REPORT

## 2020-12-11 LAB — SPECIMEN STATUS REPORT

## 2020-12-11 LAB — CULTURE, GROUP A STREP

## 2020-12-13 ENCOUNTER — Other Ambulatory Visit: Payer: Self-pay | Admitting: Family Medicine

## 2020-12-13 MED ORDER — AMOXICILLIN 500 MG PO CAPS: 500.0000 mg | ORAL_CAPSULE | Freq: Three times a day (TID) | ORAL | 0 refills | Status: AC

## 2020-12-13 MED ORDER — FLUCONAZOLE 150 MG PO TABS: ORAL_TABLET | ORAL | 0 refills | Status: DC

## 2021-03-16 ENCOUNTER — Encounter: Payer: Self-pay | Admitting: Family Medicine

## 2021-03-16 MED ORDER — NITROFURANTOIN MONOHYD MACRO 100 MG PO CAPS: 100.0000 mg | ORAL_CAPSULE | Freq: Two times a day (BID) | ORAL | 0 refills | Status: DC

## 2021-03-16 NOTE — Telephone Encounter (Signed)
Prescription sent electronically to the pharmacy. Patient notified. 

## 2021-03-16 NOTE — Telephone Encounter (Signed)
Macrobid 1 bid for 5 days Follow up if ongoing Hope you are better soon

## 2021-03-20 ENCOUNTER — Other Ambulatory Visit (HOSPITAL_COMMUNITY): Payer: Self-pay | Admitting: Family Medicine

## 2021-03-20 DIAGNOSIS — Z1231 Encounter for screening mammogram for malignant neoplasm of breast: Secondary | ICD-10-CM

## 2021-03-21 ENCOUNTER — Other Ambulatory Visit (HOSPITAL_COMMUNITY): Payer: Self-pay | Admitting: Family Medicine

## 2021-03-21 ENCOUNTER — Other Ambulatory Visit: Payer: Self-pay

## 2021-03-21 ENCOUNTER — Ambulatory Visit (HOSPITAL_COMMUNITY)
Admission: RE | Admit: 2021-03-21 | Discharge: 2021-03-21 | Disposition: A | Discharge status: Home / Self Care | Payer: BC Managed Care – PPO | Source: Ambulatory Visit | Attending: Family Medicine | Admitting: Family Medicine

## 2021-03-21 DIAGNOSIS — Z1231 Encounter for screening mammogram for malignant neoplasm of breast: Secondary | ICD-10-CM | POA: Insufficient documentation

## 2021-03-21 DIAGNOSIS — R928 Other abnormal and inconclusive findings on diagnostic imaging of breast: Secondary | ICD-10-CM

## 2021-03-23 ENCOUNTER — Encounter: Payer: Self-pay | Admitting: Family Medicine

## 2021-04-10 ENCOUNTER — Ambulatory Visit (HOSPITAL_COMMUNITY)
Admission: RE | Admit: 2021-04-10 | Discharge: 2021-04-10 | Disposition: A | Discharge status: Home / Self Care | Payer: BC Managed Care – PPO | Source: Ambulatory Visit | Attending: Family Medicine | Admitting: Family Medicine

## 2021-04-10 ENCOUNTER — Other Ambulatory Visit: Payer: Self-pay

## 2021-04-10 DIAGNOSIS — R928 Other abnormal and inconclusive findings on diagnostic imaging of breast: Secondary | ICD-10-CM | POA: Diagnosis present

## 2021-11-20 ENCOUNTER — Encounter: Payer: Self-pay | Admitting: Nurse Practitioner

## 2021-11-20 ENCOUNTER — Ambulatory Visit: Payer: BC Managed Care – PPO | Admitting: Nurse Practitioner

## 2021-11-20 VITALS — BP 110/62 | Wt 194.8 lb

## 2021-11-20 DIAGNOSIS — M5441 Lumbago with sciatica, right side: Secondary | ICD-10-CM | POA: Diagnosis not present

## 2021-11-20 MED ORDER — CYCLOBENZAPRINE HCL 5 MG PO TABS: 5.0000 mg | ORAL_TABLET | Freq: Three times a day (TID) | ORAL | 0 refills | Status: DC | PRN

## 2021-11-20 NOTE — Progress Notes (Signed)
   Subjective:    Patient ID: Jennifer Cobb, female    DOB: [birth year], 51 y.o.   MRN: [id number]  HPI  51 year old female patient presents to clinic today with complaints of lower back pain located to the right side of her back x3 days.  Patient states that she tweaked her back when putting on her seatbelt.  Patient denies any injuries to her back.  Patient states that standing, walking, sitting all aggravate her back.  Patient states back pain is relieved with lying down and resting.    Patient denies any other symptoms such as urinary incontinence or fecal incontinence.    Review of Systems  Musculoskeletal:  Positive for back pain.       Objective:   Physical Exam Vitals reviewed.  Constitutional:      General: She is not in acute distress.    Appearance: Normal appearance. She is normal weight. She is not ill-appearing, toxic-appearing or diaphoretic.  HENT:     Head: Normocephalic and atraumatic.  Cardiovascular:     Rate and Rhythm: Normal rate and regular rhythm.     Pulses: Normal pulses.     Heart sounds: Normal heart sounds. No murmur heard. Pulmonary:     Effort: Pulmonary effort is normal. No respiratory distress.     Breath sounds: Normal breath sounds. No wheezing.  Musculoskeletal:     Comments: Point tenderness to right lower back noted to L1 or L2.  Straight leg positive for right leg.  Range of motion intact however painful  Skin:    General: Skin is warm.     Capillary Refill: Capillary refill takes less than 2 seconds.  Neurological:     Mental Status: She is alert.     Comments: Grossly intact  Psychiatric:        Mood and Affect: Mood normal.        Behavior: Behavior normal.           Assessment & Plan:   1. Acute right-sided low back pain with right-sided sciatica -Patient to trial Flexeril and use ibuprofen over-the-counter for pain management - cyclobenzaprine (FLEXERIL) 5 MG tablet; Take 1 tablet (5 mg total) by mouth 3 (three) times  daily as needed for muscle spasms.  Dispense: 30 tablet; Refill: 0 -Offered physical therapy however patient states that she rather do yoga on her own -Return to clinic if symptoms do not improve or worsen     Note:  This document was prepared using Dragon voice recognition software and may include unintentional dictation errors. Note - This record has been created using [employer].  Chart creation errors have been sought, but may not always  have been located. Such creation errors do not reflect on  the standard of medical care.

## 2021-11-20 NOTE — Addendum Note (Signed)
Addended by: Dairl Ponder on: 11/20/2021 03:56 PM   Modules accepted: Orders

## 2021-12-11 ENCOUNTER — Other Ambulatory Visit: Payer: Self-pay | Admitting: Obstetrics & Gynecology

## 2022-02-07 ENCOUNTER — Encounter: Payer: Self-pay | Admitting: Family Medicine

## 2022-02-07 ENCOUNTER — Ambulatory Visit (INDEPENDENT_AMBULATORY_CARE_PROVIDER_SITE_OTHER): Payer: BC Managed Care – PPO | Admitting: Family Medicine

## 2022-02-07 VITALS — BP 108/69 | Wt 197.2 lb

## 2022-02-07 DIAGNOSIS — Z1211 Encounter for screening for malignant neoplasm of colon: Secondary | ICD-10-CM | POA: Diagnosis not present

## 2022-02-07 DIAGNOSIS — L03112 Cellulitis of left axilla: Secondary | ICD-10-CM

## 2022-02-07 MED ORDER — DOXYCYCLINE HYCLATE 100 MG PO TABS: 100.0000 mg | ORAL_TABLET | Freq: Two times a day (BID) | ORAL | 0 refills | Status: DC

## 2022-02-07 MED ORDER — FLUCONAZOLE 150 MG PO TABS: ORAL_TABLET | ORAL | 1 refills | Status: DC

## 2022-02-07 NOTE — Patient Instructions (Signed)
Compresses 10 to 20 minutes at a time 3-4 times per day Doxycycline 1 twice daily with a snack and a tall glass of water for 7 to 10 days If progresses to abscess please let us know and follow back up with [place] ASAP thank you

## 2022-02-07 NOTE — Progress Notes (Signed)
   Subjective:    Patient ID: Jennifer Cobb, female    DOB: August 18, 1970, 52 y.o.   MRN: [id number]  HPI Pt arrives due to knot under left arm pit. Noticed 2 days ago. Area is painful and tender. Pt believes it may be just a pimple.  Denies high fever chills or sweats relates a soreness and some swelling there  Review of Systems     Objective:   Physical Exam Patient has a reddened area swollen under the left axilla consistent with a small localized infection of the follicle.       Assessment & Plan:  Recommend antibiotics warm compresses watch for abscess development   Patient was encouraged to watch for any abscess If problems follow-up immediately Female wellness recommended Colonoscopy recommended Lab work recommended

## 2022-02-08 ENCOUNTER — Encounter: Payer: Self-pay | Admitting: *Deleted

## 2022-03-08 ENCOUNTER — Telehealth: Payer: Self-pay | Admitting: *Deleted

## 2022-03-08 NOTE — Telephone Encounter (Signed)
  Procedure: Colonoscopy  Estimated body mass index is 32.82 kg/m as calculated from the following:   Height as of 08/12/20: 5\' 5"  (1.651 m).   Weight as of 02/07/22: 197 lb 3.2 oz (89.4 kg).   Have you had a colonoscopy before?  2, done by Dr. Salley Slaughter  Do you have family history of colon cancer?  Yes mother  Do you have a family history of polyps? yes  Previous colonoscopy with polyps removed? yes  Do you have a history colorectal cancer?   no  Are you diabetic?  no  Do you have a prosthetic or mechanical heart valve? no  Do you have a pacemaker/defibrillator?   no  Have you had endocarditis/atrial fibrillation?  no  Do you use supplemental oxygen/CPAP?  no  Have you had joint replacement within the last 12 months?  no  Do you tend to be constipated or have to use laxatives?  no   Do you have history of alcohol use? If yes, how much and how often.  no  Do you have history or are you using drugs? If yes, what do are you  using?  no  Have you ever had a stroke/heart attack?  no  Have you ever had a heart or other vascular stent placed,?no  Do you take weight loss medication? no  female patients,: have you had a hysterectomy? no                              are you post menopausal?                                do you still have your menstrual cycle? no    Date of last menstrual period?   Do you take any blood-thinning medications such as: (Plavix, aspirin, Coumadin, Aggrenox, Brilinta, Xarelto, Eliquis, Pradaxa, Savaysa or Effient)? no  If yes we need the name, milligram, dosage and who is prescribing doctor:               Current Outpatient Medications  Medication Sig Dispense Refill   Biotin (BIOTIN 5000) 5 MG CAPS Take by mouth.     ferrous sulfate 325 (65 FE) MG tablet Take 325 mg by mouth daily with breakfast.     loratadine (CLARITIN) 10 MG tablet Take 10 mg by mouth daily.     sertraline (ZOLOFT) 50 MG tablet TAKE ONE (1) TABLET BY MOUTH EVERY DAY 30 tablet  11   Turmeric (QC TUMERIC COMPLEX) 500 MG CAPS Take by mouth.     No current facility-administered medications for this visit.    Allergies  Allergen Reactions   Gluten Meal Diarrhea and Nausea And Vomiting

## 2022-03-19 NOTE — Telephone Encounter (Signed)
LMTRC

## 2022-03-19 NOTE — Telephone Encounter (Signed)
2009 colonoscopy with hemorrhoids. I don't know if any more recently.   Hold iron 7 days prior.   ASA 2.

## 2022-03-26 ENCOUNTER — Encounter: Payer: Self-pay | Admitting: *Deleted

## 2022-03-26 ENCOUNTER — Other Ambulatory Visit: Payer: Self-pay | Admitting: *Deleted

## 2022-03-26 MED ORDER — PEG 3350-KCL-NA BICARB-NACL 420 G PO SOLR: 4000.0000 mL | Freq: Once | ORAL | 0 refills | Status: AC

## 2022-03-26 NOTE — Telephone Encounter (Signed)
Pt has been scheduled for 04/17/22. Instructions sent via MyChart and prep sent to the pharmacy.

## 2022-03-28 ENCOUNTER — Encounter: Payer: Self-pay | Admitting: *Deleted

## 2022-03-28 ENCOUNTER — Encounter (INDEPENDENT_AMBULATORY_CARE_PROVIDER_SITE_OTHER): Payer: Self-pay | Admitting: *Deleted

## 2022-04-23 ENCOUNTER — Other Ambulatory Visit: Payer: Self-pay

## 2022-04-23 ENCOUNTER — Ambulatory Visit (HOSPITAL_COMMUNITY): Payer: BC Managed Care – PPO | Admitting: Anesthesiology

## 2022-04-23 ENCOUNTER — Encounter (HOSPITAL_COMMUNITY)
Admission: RE | Disposition: A | Discharge status: Home / Self Care | Payer: Self-pay | Source: Home / Self Care | Attending: Internal Medicine

## 2022-04-23 ENCOUNTER — Ambulatory Visit (HOSPITAL_COMMUNITY)
Admission: RE | Admit: 2022-04-23 | Discharge: 2022-04-23 | Disposition: A | Discharge status: Home / Self Care | Payer: BC Managed Care – PPO | Attending: Internal Medicine | Admitting: Internal Medicine

## 2022-04-23 ENCOUNTER — Encounter (HOSPITAL_COMMUNITY): Payer: Self-pay

## 2022-04-23 DIAGNOSIS — Z1211 Encounter for screening for malignant neoplasm of colon: Secondary | ICD-10-CM | POA: Insufficient documentation

## 2022-04-23 DIAGNOSIS — Q438 Other specified congenital malformations of intestine: Secondary | ICD-10-CM | POA: Diagnosis not present

## 2022-04-23 DIAGNOSIS — Z800 Family history of malignant neoplasm of digestive organs: Secondary | ICD-10-CM | POA: Diagnosis not present

## 2022-04-23 HISTORY — PX: COLONOSCOPY WITH PROPOFOL: SHX5780

## 2022-04-23 SURGERY — COLONOSCOPY WITH PROPOFOL
Anesthesia: General

## 2022-04-23 MED ORDER — LIDOCAINE HCL (CARDIAC) PF 100 MG/5ML IV SOSY
PREFILLED_SYRINGE | INTRAVENOUS | Status: DC | PRN
  Administered 2022-04-23: 50 mg via INTRATRACHEAL

## 2022-04-23 MED ORDER — LACTATED RINGERS IV SOLN: INTRAVENOUS | Status: DC

## 2022-04-23 MED ORDER — PROPOFOL 10 MG/ML IV BOLUS
INTRAVENOUS | Status: DC | PRN
  Administered 2022-04-23: 100 mg via INTRAVENOUS

## 2022-04-23 MED ORDER — STERILE WATER FOR IRRIGATION IR SOLN
Status: DC | PRN
  Administered 2022-04-23: 60 mL

## 2022-04-23 MED ORDER — PROPOFOL 500 MG/50ML IV EMUL
INTRAVENOUS | Status: DC | PRN
  Administered 2022-04-23: 200 ug/kg/min via INTRAVENOUS

## 2022-04-23 NOTE — H&P (Signed)
Primary Care Physician:  Kathyrn Drown, MD Primary Gastroenterologist:  Dr. Abbey Chatters  Pre-Procedure History & Physical: HPI:  Jennifer Cobb is a 52 y.o. female is here for a colonoscopy to be performed for high risk colon cancer screening purposes, family history of colon cancer in mother (in her 53s)  Past Medical History:  Diagnosis Date   Anemia    Arthritis    Celiac disease     Past Surgical History:  Procedure Laterality Date   COLONOSCOPY     EYE SURGERY Left    ORIF WRIST FRACTURE Right 08/12/2020   Procedure: Right distal radius open reduction and internal fixation with repair as indicated;  Surgeon: Iran Planas, MD;  Location: [place];  Service: Orthopedics;  Laterality: Right;  36min    Prior to Admission medications   Medication Sig Start Date End Date Taking? Authorizing Provider  acetaminophen (TYLENOL) 500 MG tablet Take 500-1,000 mg by mouth every 6 (six) hours as needed (pain.).   Yes [provider]  BIOTIN PO Take 1 capsule by mouth in the morning.   Yes [provider]  Cyanocobalamin (VITAMIN B-12 PO) Take 1 tablet by mouth in the morning.   Yes [provider]  cyclobenzaprine (FLEXERIL) 5 MG tablet Take 5 mg by mouth 3 (three) times daily as needed for muscle spasms. 11/20/21  Yes [provider]  FOLIC ACID PO Take 1 tablet by mouth in the morning.   Yes [provider]  loratadine (CLARITIN) 10 MG tablet Take 10 mg by mouth in the morning.   Yes [provider]  sertraline (ZOLOFT) 50 MG tablet TAKE ONE (1) TABLET BY MOUTH EVERY DAY 12/12/21  Yes Florian Buff, MD  Soft Lens Products (B & L SENSITIVE EYES SALINE) 0.4 % SOLN Place 1 drop into both eyes in the morning.   Yes [provider]  TURMERIC PO Take 1 capsule by mouth daily.   Yes [provider]  ferrous sulfate 325 (65 FE) MG tablet Take 325 mg by mouth daily with breakfast.    [provider]    Allergies as of  03/26/2022 - Review Complete 02/07/2022  Allergen Reaction Noted   Gluten meal Diarrhea and Nausea And Vomiting 08/10/2020    Family History  Problem Relation Age of Onset   Cancer Mother 29       colon   Hypertension Father    Autism Son    Syncope episode Daughter     Social History   Socioeconomic History   Marital status: Married    Spouse name: Not on file   Number of children: Not on file   Years of education: Not on file   Highest education level: Not on file  Occupational History   Not on file  Tobacco Use   Smoking status: Never   Smokeless tobacco: Never  Substance and Sexual Activity   Alcohol use: No   Drug use: No   Sexual activity: Yes    Birth control/protection: Surgical    Comment: vasectomy  Other Topics Concern   Not on file  Social History Narrative   Not on file   Social Determinants of Health   Financial Resource Strain: Not on file  Food Insecurity: Not on file  Transportation Needs: Not on file  Physical Activity: Not on file  Stress: Not on file  Social Connections: Not on file  Intimate Partner Violence: Not on file    Review of Systems: See HPI,  otherwise negative ROS  Physical Exam: Vital signs in last 24 hours: Temp:  [98.8 F (37.1 C)] 98.8 F (37.1 C) (03/26 1139) Pulse Rate:  [64] 64 (03/26 1139) Resp:  [13] 13 (03/26 1139) BP: (119)/(71) 119/71 (03/26 1139) SpO2:  [96 %] 96 % (03/26 1139)   General:   Alert,  Well-developed, well-nourished, pleasant and cooperative in NAD Head:  Normocephalic and atraumatic. Eyes:  Sclera clear, no icterus.   Conjunctiva pink. Ears:  Normal auditory acuity. Nose:  No deformity, discharge,  or lesions. Mouth:  No deformity or lesions, dentition normal. Neck:  Supple; no masses or thyromegaly. Lungs:  Clear throughout to auscultation.   No wheezes, crackles, or rhonchi. No acute distress. Heart:  Regular rate and rhythm; no murmurs, clicks, rubs,  or gallops. Abdomen:  Soft,  nontender and nondistended. No masses, hepatosplenomegaly or hernias noted. Normal bowel sounds, without guarding, and without rebound.   Msk:  Symmetrical without gross deformities. Normal posture. Extremities:  Without clubbing or edema. Neurologic:  Alert and  oriented x4;  grossly normal neurologically. Skin:  Intact without significant lesions or rashes. Cervical Nodes:  No significant cervical adenopathy. Psych:  Alert and cooperative. Normal mood and affect.  Impression/Plan: Jennifer Cobb is here for a colonoscopy to be performed for high risk colon cancer screening purposes, family history of colon cancer in mother (in her 17s)  The risks of the procedure including infection, bleed, or perforation as well as benefits, limitations, alternatives and imponderables have been reviewed with the patient. Questions have been answered. All parties agreeable.

## 2022-04-23 NOTE — Anesthesia Postprocedure Evaluation (Signed)
Anesthesia Post Note  Patient: Jennifer Cobb  Procedure(s) Performed: COLONOSCOPY WITH PROPOFOL  Patient location during evaluation: Phase II Anesthesia Type: General Level of consciousness: awake and alert and oriented Pain management: pain level controlled Vital Signs Assessment: post-procedure vital signs reviewed and stable Respiratory status: spontaneous breathing, nonlabored ventilation and respiratory function stable Cardiovascular status: blood pressure returned to baseline and stable Postop Assessment: no apparent nausea or vomiting Anesthetic complications: no  No notable events documented.   Last Vitals:  Vitals:   04/23/22 1139 04/23/22 1321  BP: 119/71 117/77  Pulse: 64 72  Resp: 13 14  Temp: 37.1 C 36.6 C  SpO2: 96% 99%    Last Pain:  Vitals:   04/23/22 1321  TempSrc: Oral  PainSc: 0-No pain                 [name]

## 2022-04-23 NOTE — Op Note (Signed)
[facility] Patient Name: Jennifer Cobb Procedure Date: 04/23/2022 12:11 PM MRN: [id number] Date of Birth: 1970/10/19 Attending MD: Elon Alas. Edgar Frisk, [id number] CSN: [id number] Age: 52 Admit Type: Outpatient Procedure:                Colonoscopy Indications:              Screening in patient at increased risk: Colorectal                            cancer in mother before age 34 Providers:                Elon Alas. Abbey Chatters, DO, Caprice Kluver, [place]                            Risa Grill, Technician Referring MD:              Medicines:                See the Anesthesia note for documentation of the                            administered medications Complications:            No immediate complications. Estimated Blood Loss:     Estimated blood loss: none. Procedure:                Pre-Anesthesia Assessment:                           - The anesthesia plan was to use monitored                            anesthesia care (MAC).                           After obtaining informed consent, the colonoscope                            was passed under direct vision. Throughout the                            procedure, the patient's blood pressure, pulse, and                            oxygen saturations were monitored continuously. The                            [id number]-HQ190L [id number]) scope was introduced through                            the anus and advanced to the the cecum, identified                            by appendiceal orifice and ileocecal valve. The                            colonoscopy was technically  difficult and complex                            due to significant looping and a tortuous colon.                            Successful completion of the procedure was aided by                            applying abdominal pressure. The patient tolerated                            the procedure well. The quality of the bowel                            preparation was  evaluated using the BBPS [facility]                            Bowel Preparation Scale) with scores of: Right                            Colon = 3, Transverse Colon = 3 and Left Colon = 3                            (entire mucosa seen well with no residual staining,                            small fragments of stool or opaque liquid). The                            total BBPS score equals 9. Scope In: 1:00:38 PM Scope Out: 1:17:47 PM Scope Withdrawal Time: 0 hours 8 minutes 52 seconds  Total Procedure Duration: 0 hours 17 minutes 9 seconds  Findings:      The colon (entire examined portion) was moderately tortuous. Advancing       the scope required applying abdominal pressure.      The exam was otherwise without abnormality. Impression:               - Tortuous colon.                           - The examination was otherwise normal.                           - No specimens collected. Moderate Sedation:      Per Anesthesia Care Recommendation:           - Patient has a contact number available for                            emergencies. The signs and symptoms of potential                            delayed complications were discussed with the  patient. Return to normal activities tomorrow.                            Written discharge instructions were provided to the                            patient.                           - Resume previous diet.                           - Continue present medications.                           - Repeat colonoscopy in 5 years for high risk                            screening purposes/family history of colon cancer                            in mother.                           - Return to GI clinic PRN. Procedure Code(s):        --- Professional ---                           [id number], Colorectal cancer screening; colonoscopy on                            individual at high risk Diagnosis Code(s):        --- Professional  ---                           Z80.0, Family history of malignant neoplasm of                            digestive organs                           Q43.8, Other specified congenital malformations of                            intestine CPT copyright 2022 American Medical Association. All rights reserved. The codes documented in this report are preliminary and upon coder review may  be revised to meet current compliance requirements. Elon Alas. Abbey Chatters, DO [place] Abbey Chatters, DO 04/23/2022 1:20:13 PM This report has been signed electronically. Number of Addenda: 0

## 2022-04-23 NOTE — Discharge Instructions (Addendum)
  Colonoscopy Discharge Instructions  Read the instructions outlined below and refer to this sheet in the next few weeks. These discharge instructions provide you with general information on caring for yourself after you leave the hospital. Your doctor may also give you specific instructions. While your treatment has been planned according to the most current medical practices available, unavoidable complications occasionally occur.   ACTIVITY You may resume your regular activity, but move at a slower pace for the next 24 hours.  Take frequent rest periods for the next 24 hours.  Walking will help get rid of the air and reduce the bloated feeling in your belly (abdomen).  No driving for 24 hours (because of the medicine (anesthesia) used during the test).   Do not sign any important legal documents or operate any machinery for 24 hours (because of the anesthesia used during the test).  NUTRITION Drink plenty of fluids.  You may resume your normal diet as instructed by your doctor.  Begin with a light meal and progress to your normal diet. Heavy or fried foods are harder to digest and may make you feel sick to your stomach (nauseated).  Avoid alcoholic beverages for 24 hours or as instructed.  MEDICATIONS You may resume your normal medications unless your doctor tells you otherwise.  WHAT YOU CAN EXPECT TODAY Some feelings of bloating in the abdomen.  Passage of more gas than usual.  Spotting of blood in your stool or on the toilet paper.  IF YOU HAD POLYPS REMOVED DURING THE COLONOSCOPY: No aspirin products for 7 days or as instructed.  No alcohol for 7 days or as instructed.  Eat a soft diet for the next 24 hours.  FINDING OUT THE RESULTS OF YOUR TEST Not all test results are available during your visit. If your test results are not back during the visit, make an appointment with your caregiver to find out the results. Do not assume everything is normal if you have not heard from your  caregiver or the medical facility. It is important for you to follow up on all of your test results.  SEEK IMMEDIATE MEDICAL ATTENTION IF: You have more than a spotting of blood in your stool.  Your belly is swollen (abdominal distention).  You are nauseated or vomiting.  You have a temperature over 101.  You have abdominal pain or discomfort that is severe or gets worse throughout the day.   Your colonoscopy was relatively unremarkable.  I did not find any polyps or evidence of colon cancer.  I recommend repeating colonoscopy in 5 years given your family history of colon cancer. Otherwise follow-up with GI as needed.   I hope you have a great rest of your week!  Jennifer Cobb. Abbey Chatters, D.O. Gastroenterology and Hepatology [facility]

## 2022-04-23 NOTE — Transfer of Care (Signed)
Immediate Anesthesia Transfer of Care Note  Patient: Jennifer Cobb  Procedure(s) Performed: COLONOSCOPY WITH PROPOFOL  Patient Location: Endoscopy Unit  Anesthesia Type:General  Level of Consciousness: awake, alert , oriented, and patient cooperative  Airway & Oxygen Therapy: Patient Spontanous Breathing  Post-op Assessment: Report given to RN, Post -op Vital signs reviewed and stable, and Patient moving all extremities  Post vital signs: Reviewed and stable  Last Vitals:  Vitals Value Taken Time  BP    Temp    Pulse    Resp    SpO2      Last Pain:  Vitals:   04/23/22 1253  TempSrc:   PainSc: 0-No pain         Complications: No notable events documented.

## 2022-04-23 NOTE — Anesthesia Preprocedure Evaluation (Addendum)
Anesthesia Evaluation  Patient identified by MRN, date of birth, ID band Patient awake    Reviewed: Allergy & Precautions, H&P , NPO status , Patient's Chart, lab work & pertinent test results  Airway Mallampati: II  TM Distance: >3 FB Neck ROM: Full    Dental  (+) Dental Advisory Given   Pulmonary neg pulmonary ROS   Pulmonary exam normal breath sounds clear to auscultation       Cardiovascular negative cardio ROS Normal cardiovascular exam Rhythm:Regular Rate:Normal     Neuro/Psych negative neurological ROS  negative psych ROS   GI/Hepatic negative GI ROS, Neg liver ROS,,,  Endo/Other  negative endocrine ROS    Renal/GU negative Renal ROS  negative genitourinary   Musculoskeletal  (+) Arthritis , Osteoarthritis,    Abdominal   Peds negative pediatric ROS (+)  Hematology  (+) Blood dyscrasia, anemia   Anesthesia Other Findings   Reproductive/Obstetrics negative OB ROS                             Anesthesia Physical Anesthesia Plan  ASA: 2  Anesthesia Plan: General   Post-op Pain Management: Minimal or no pain anticipated   Induction: Intravenous  PONV Risk Score and Plan: 1 and Treatment may vary due to age or medical condition and Propofol infusion  Airway Management Planned: Nasal Cannula and Natural Airway  Additional Equipment:   Intra-op Plan:   Post-operative Plan:   Informed Consent: I have reviewed the patients History and Physical, chart, labs and discussed the procedure including the risks, benefits and alternatives for the proposed anesthesia with the patient or authorized representative who has indicated his/her understanding and acceptance.     Dental advisory given  Plan Discussed with: CRNA and Surgeon  Anesthesia Plan Comments:         Anesthesia Quick Evaluation

## 2022-05-02 ENCOUNTER — Encounter (HOSPITAL_COMMUNITY): Payer: Self-pay | Admitting: Internal Medicine

## 2022-11-07 IMAGING — MG MM DIGITAL SCREENING BILAT W/ TOMO AND CAD
8 series · 8 of 24 positions shown · non-contrast
Comparison: 09/24/2013

CLINICAL DATA: Screening.

EXAM:
DIGITAL SCREENING BILATERAL MAMMOGRAM WITH TOMOSYNTHESIS AND CAD
TECHNIQUE: Bilateral screening digital craniocaudal and mediolateral oblique
mammograms were obtained. Bilateral screening digital breast
tomosynthesis was performed. The images were evaluated with
computer-aided detection.

[R CC synth-2D]
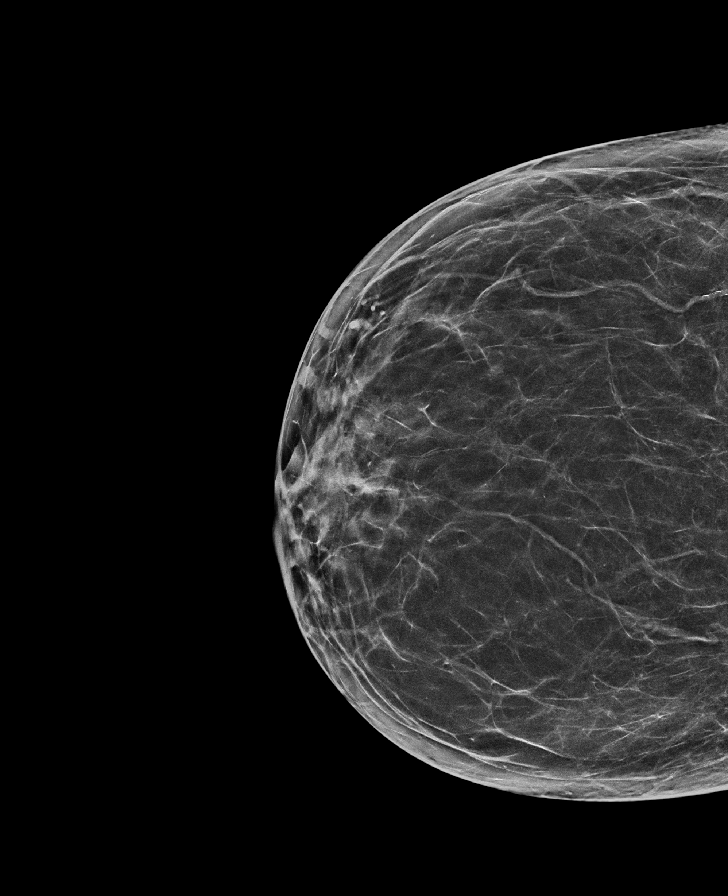

[L MLO synth-2D]
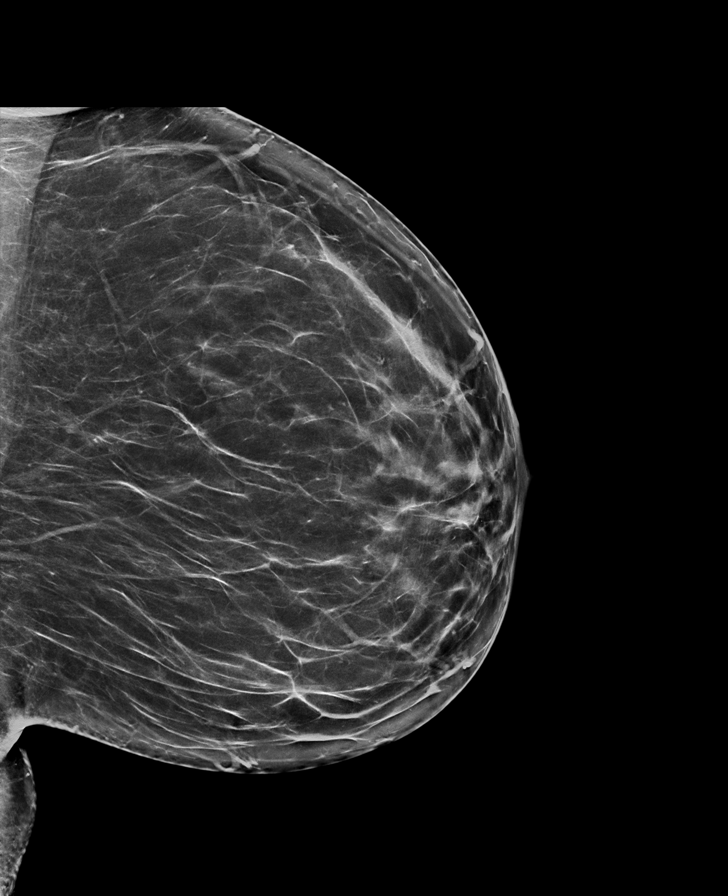

[L CC synth-2D]
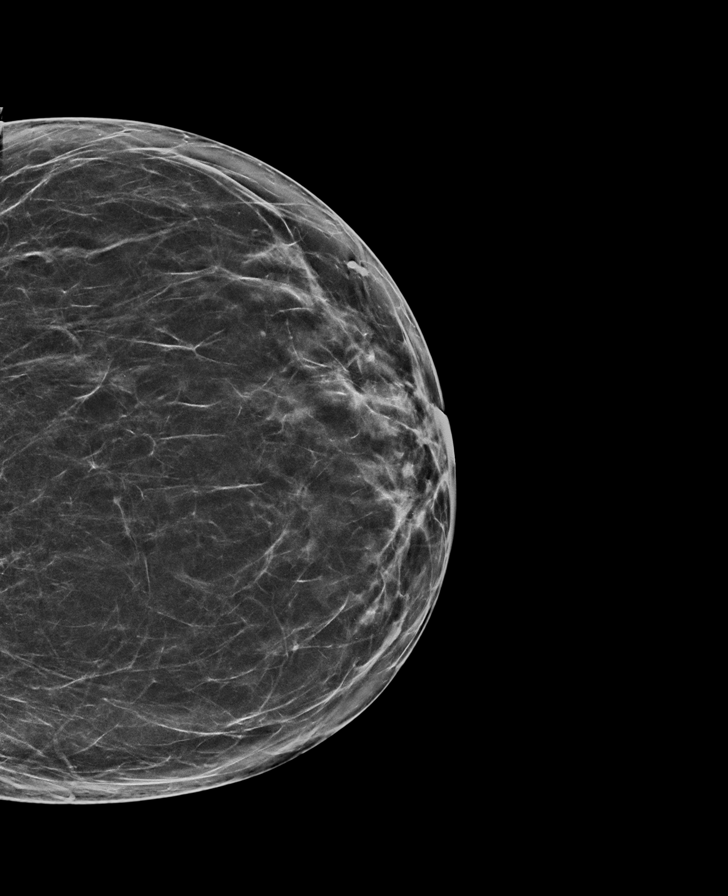

[R MLO synth-2D]
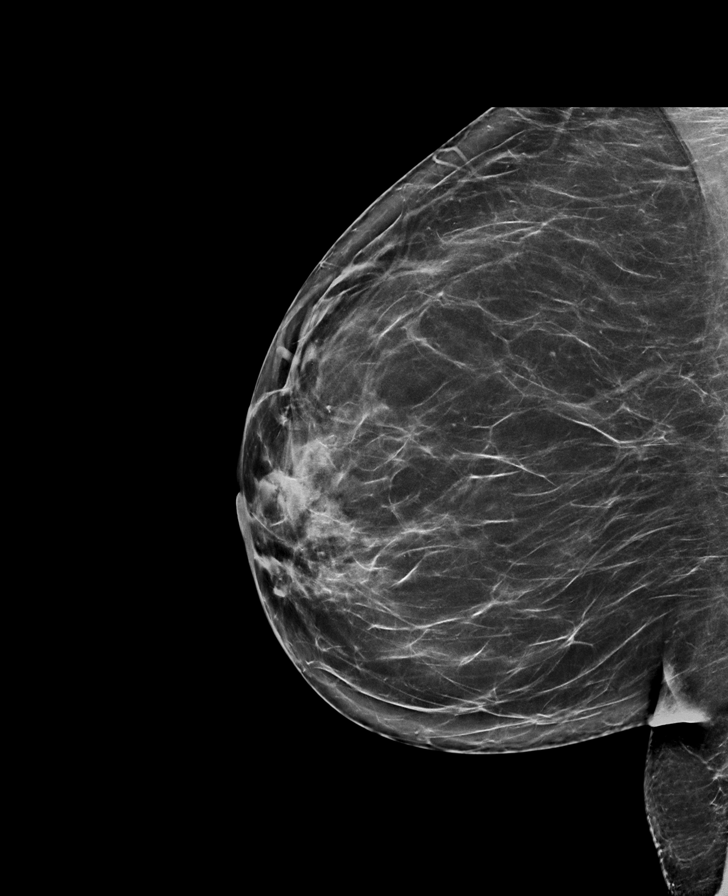

[L CC tomo · tomo slice 35/69.0]
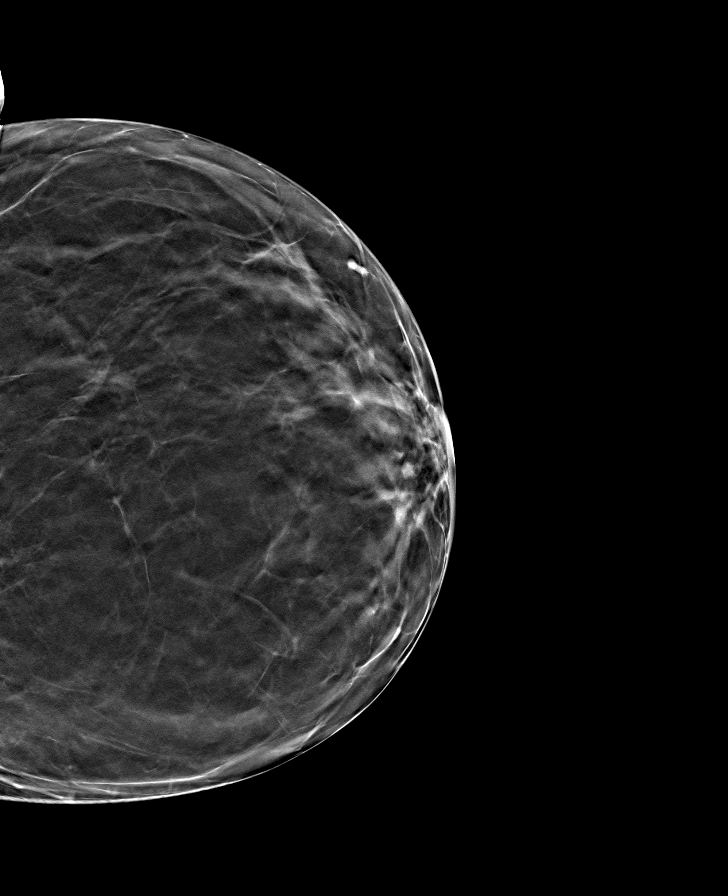

[R CC tomo · tomo slice 36/71.0]
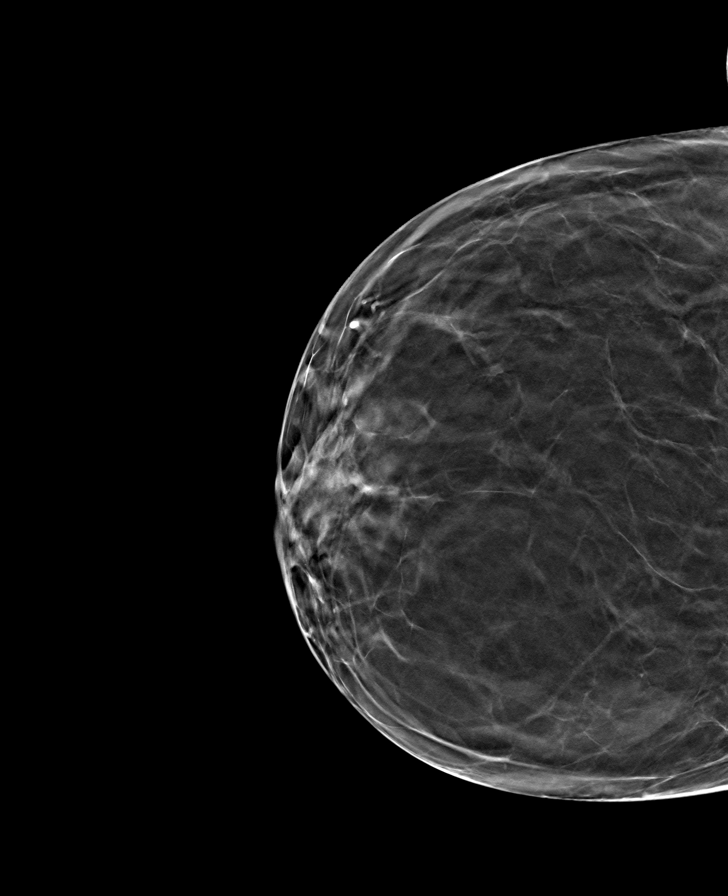

[L MLO tomo · tomo slice 39/76.0]
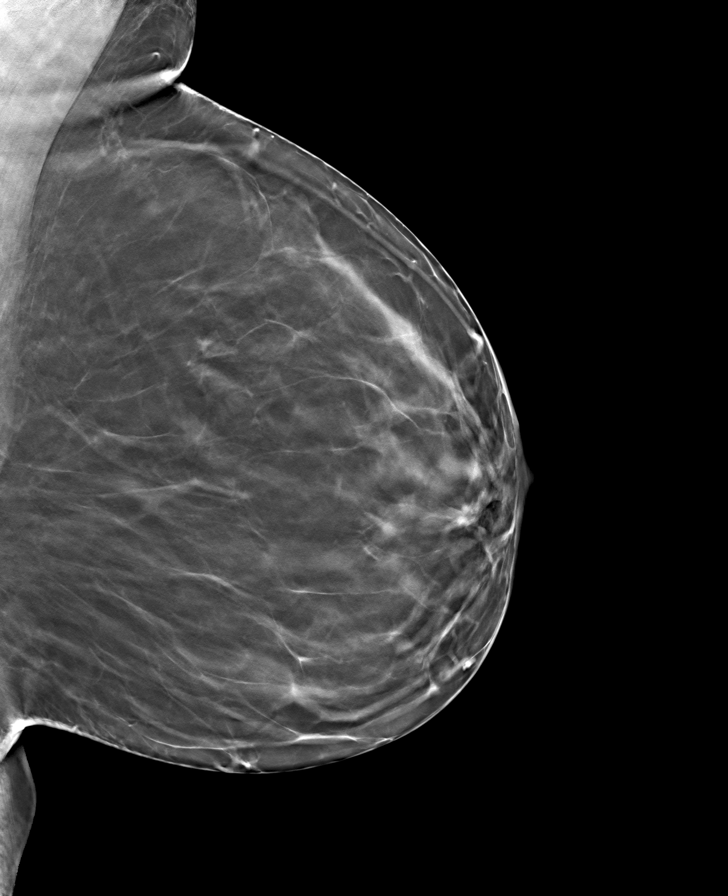

[R MLO tomo · tomo slice 39/76.0]
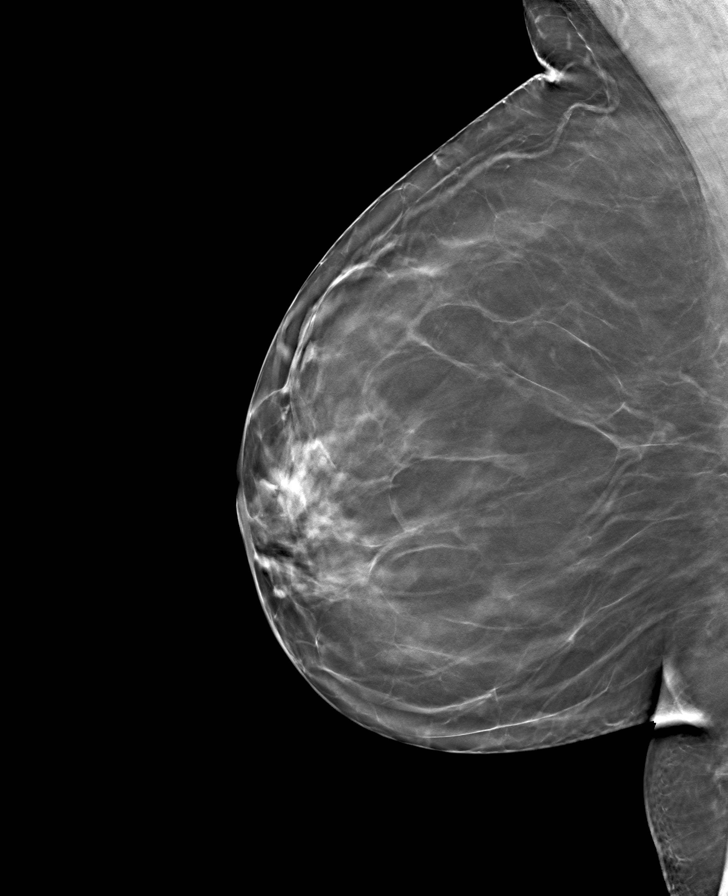

[8 of 24 positions shown; findings below may reference images not displayed]

ACR Breast Density Category b: There are scattered areas of
fibroglandular density.
FINDINGS: In the right breast, a possible mass warrants further evaluation. In
the left breast, no findings suspicious for malignancy.
IMPRESSION: Further evaluation is suggested for a possible mass in the right
breast.

RECOMMENDATION:
Diagnostic mammogram and possibly ultrasound of the right breast.
(Code:[id number])

The patient will be contacted regarding the findings, and additional
imaging will be scheduled.

BI-RADS CATEGORY  0: Incomplete. Need additional imaging evaluation
and/or prior mammograms for comparison.

## 2022-12-17 ENCOUNTER — Other Ambulatory Visit: Payer: Self-pay | Admitting: Obstetrics & Gynecology

## 2023-09-02 ENCOUNTER — Other Ambulatory Visit (HOSPITAL_COMMUNITY)
Admission: RE | Admit: 2023-09-02 | Discharge: 2023-09-02 | Disposition: A | Discharge status: Home / Self Care | Source: Ambulatory Visit | Attending: Obstetrics and Gynecology | Admitting: Obstetrics and Gynecology

## 2023-09-02 ENCOUNTER — Encounter: Payer: Self-pay | Admitting: Obstetrics and Gynecology

## 2023-09-02 ENCOUNTER — Ambulatory Visit: Payer: Self-pay | Admitting: Obstetrics and Gynecology

## 2023-09-02 VITALS — BP 113/74 | HR 71 | Ht 65.0 in | Wt 199.0 lb

## 2023-09-02 DIAGNOSIS — Z01419 Encounter for gynecological examination (general) (routine) without abnormal findings: Secondary | ICD-10-CM

## 2023-09-02 DIAGNOSIS — N950 Postmenopausal bleeding: Secondary | ICD-10-CM | POA: Diagnosis not present

## 2023-09-02 DIAGNOSIS — Z124 Encounter for screening for malignant neoplasm of cervix: Secondary | ICD-10-CM | POA: Insufficient documentation

## 2023-09-02 DIAGNOSIS — Z1231 Encounter for screening mammogram for malignant neoplasm of breast: Secondary | ICD-10-CM

## 2023-09-02 DIAGNOSIS — N941 Unspecified dyspareunia: Secondary | ICD-10-CM | POA: Diagnosis not present

## 2023-09-02 NOTE — Progress Notes (Signed)
 ANNUAL EXAM Patient name: Jennifer Cobb MRN [id number]  Date of birth: 10/18/1970 Chief Complaint:   Annual Exam  History of Present Illness:   Jennifer Cobb is a 53 y.o. [phone]  female being seen today for a routine annual exam.  Current complaints:Reports that her  period stopped on their own in 2017, no bleeding since then. Then 6 months ago she started bleeding intermittently. She endorses  sometimes heavy, sometimes light. Spotting almost daily. She also endorses pain with intercourse   Patient's last menstrual period was 09/02/2015 (exact date).   Upstream - 09/02/23 1118       Pregnancy Intention Screening   Does the patient want to become pregnant in the next year? No    Does the patient's partner want to become pregnant in the next year? No    Would the patient like to discuss contraceptive options today? No      Contraception Wrap Up   Current Method Vasectomy    End Method Vasectomy         The pregnancy intention screening data noted above was reviewed. Potential methods of contraception were discussed. The patient elected to proceed with Vasectomy.   Gynecologic History Patient's last menstrual period was . Contraception:  Last Pap:09/15/13 . Results were: ASCUS Last mammogram: .2023 Results were:abnormal benign lesion R breast     09/02/2023   11:18 AM 02/07/2022    7:58 AM 11/20/2021    2:48 PM  Depression screen PHQ 2/9  Decreased Interest 0 0 0  Down, Depressed, Hopeless 0 0 0  PHQ - 2 Score 0 0 0  Altered sleeping 0    Tired, decreased energy 1    Change in appetite 1    Feeling bad or failure about yourself  0    Trouble concentrating 0    Moving slowly or fidgety/restless 0    Suicidal thoughts 0    PHQ-9 Score 2          09/02/2023   11:18 AM  GAD 7 : Generalized Anxiety Score  Nervous, Anxious, on Edge 1  Control/stop worrying 0  Worry too much - different things 1  Trouble relaxing 0  Restless 0  Easily annoyed or irritable 0   Afraid - awful might happen 0  Total GAD 7 Score 2     Review of Systems:   Pertinent items are noted in HPI Denies any headaches, blurred vision, fatigue, shortness of breath, chest pain, abdominal pain, abnormal vaginal discharge/itching/odor/irritation, problems with periods, bowel movements, urination, or intercourse unless otherwise stated above. Pertinent History Reviewed:  Reviewed past medical,surgical, social and family history.  Reviewed problem list, medications and allergies. Physical Assessment:   Vitals:   09/02/23 1109  BP: 113/74  Pulse: 71  Weight: 199 lb (90.3 kg)  Height: 5' 5 (1.651 m)  Body mass index is 33.12 kg/m.        Physical Examination:   General appearance - well appearing, and in no distress  Mental status - alert, oriented   Psych:  She has a normal mood and affect  Skin - warm and dry, normal color  Chest - effort normal  Neck:  midline trachea, no thyromegaly or nodules  Breasts - breasts appear normal, no suspicious masses, no skin or nipple changes or  axillary nodes  Abdomen - soft, nontender, nondistended  Pelvic - VULVA: normal appearing vulva with no masses, tenderness or lesions  VAGINA: normal appearing vagina with normal color and discharge,  no lesions  CERVIX: normal appearing cervix without discharge or lesions, no CMT  Thin prep pap is done w HR HPV cotesting  UTERUS: uterus is felt to be normal size, shape, consistency and nontender   ADNEXA: No adnexal masses or tenderness noted.  Extremities:  No swelling or varicosities noted  Chaperone present for exam  No results found for this or any previous visit (from the past 24 hours).  Assessment & Plan:  1. Encounter for annual routine gynecological examination (Primary) Pap today Schedule mammogram She has PCP she has not seen yet this year UTD on colonoscopy   2. Routine Papanicolaou smear  - Cytology - PAP  3. Postmenopausal bleeding Discussed need for pelvic  ultrasound to r/o endometrial tumors, polyps, fibroids Will schedule pelvic ultrasound and disucssed follow up with MD for EMB, discussed what to expect with emb - US  PELVIC COMPLETE WITH TRANSVAGINAL; Future   4. Encounter for screening mammogram for malignant neoplasm of breast  - MM 3D SCREENING MAMMOGRAM BILATERAL BREAST; Future  5. Dyspareunia in female Discussed changes postmenopausal, discussed lubrication, coconut oil, will hold off on vaginal estrogen until ultrasound  - Cervicovaginal ancillary only  Labs/procedures today:   Mammogram: schedule screening mammo as soon as possible, or sooner if problems Colonoscopy: per GI, or sooner if problems  Orders Placed This Encounter  Procedures   US  PELVIC COMPLETE WITH TRANSVAGINAL   MM 3D SCREENING MAMMOGRAM BILATERAL BREAST    Meds: No orders of the defined types were placed in this encounter.   Follow-up: Return 3 weeks with follow up with Dr marilynn, potential EMB. Future Appointments  Date Time Provider Department Center  09/08/2023  8:30 AM [facility] US  2 [id number] [facility]  10/07/2023  8:50 AM Ozan, Jennifer, DO [facility]    Nidia Daring, FNP

## 2023-09-05 ENCOUNTER — Other Ambulatory Visit (HOSPITAL_COMMUNITY)
Admission: RE | Admit: 2023-09-05 | Discharge: 2023-09-05 | Disposition: A | Discharge status: Home / Self Care | Source: Ambulatory Visit | Attending: Obstetrics & Gynecology | Admitting: Obstetrics & Gynecology

## 2023-09-05 ENCOUNTER — Other Ambulatory Visit (INDEPENDENT_AMBULATORY_CARE_PROVIDER_SITE_OTHER): Admitting: *Deleted

## 2023-09-05 DIAGNOSIS — N950 Postmenopausal bleeding: Secondary | ICD-10-CM | POA: Insufficient documentation

## 2023-09-05 DIAGNOSIS — N941 Unspecified dyspareunia: Secondary | ICD-10-CM

## 2023-09-05 NOTE — Progress Notes (Signed)
   NURSE VISIT- VAGINITIS/STD/POC  SUBJECTIVE:  Jennifer Cobb is a 53 y.o. [phone] GYN patientfemale here for a vaginal swab for vaginitis screening.  She reports the following symptoms: abnormal bleeding: post-menopausal for n/a days. Denies abnormal vaginal bleeding, significant pelvic pain, fever, or UTI symptoms.  OBJECTIVE:  LMP 09/02/2015 (Exact Date) Comment: ongoing bleeding  Appears well, in no apparent distress  ASSESSMENT: Vaginal swab for vaginitis screening  PLAN: Self-collected vaginal probe for Bacterial Vaginosis, Yeast sent to lab Treatment: to be determined once results are received Follow-up as needed if symptoms persist/worsen, or new symptoms develop  Alan LITTIE Fischer  09/05/2023 8:47 AM

## 2023-09-08 ENCOUNTER — Other Ambulatory Visit: Payer: Self-pay | Admitting: Medical Genetics

## 2023-09-08 ENCOUNTER — Ambulatory Visit: Payer: Self-pay | Admitting: Obstetrics and Gynecology

## 2023-09-08 ENCOUNTER — Ambulatory Visit
Admission: RE | Admit: 2023-09-08 | Discharge: 2023-09-08 | Disposition: A | Discharge status: Home / Self Care | Source: Ambulatory Visit | Attending: Obstetrics and Gynecology

## 2023-09-08 DIAGNOSIS — N950 Postmenopausal bleeding: Secondary | ICD-10-CM

## 2023-09-08 LAB — CYTOLOGY - PAP
Comment: NEGATIVE
Diagnosis: REACTIVE
High risk HPV: NEGATIVE

## 2023-09-08 LAB — CERVICOVAGINAL ANCILLARY ONLY
Bacterial Vaginitis (gardnerella): NEGATIVE
Candida Glabrata: NEGATIVE
Candida Vaginitis: NEGATIVE
Comment: NEGATIVE
Comment: NEGATIVE
Comment: NEGATIVE

## 2023-09-10 ENCOUNTER — Other Ambulatory Visit (HOSPITAL_COMMUNITY)
Admission: RE | Admit: 2023-09-10 | Discharge: 2023-09-10 | Disposition: A | Discharge status: Home / Self Care | Source: Ambulatory Visit | Attending: Oncology | Admitting: Oncology

## 2023-09-10 ENCOUNTER — Ambulatory Visit (HOSPITAL_COMMUNITY)
Admission: RE | Admit: 2023-09-10 | Discharge: 2023-09-10 | Disposition: A | Discharge status: Home / Self Care | Source: Ambulatory Visit | Attending: Obstetrics and Gynecology | Admitting: Obstetrics and Gynecology

## 2023-09-10 ENCOUNTER — Encounter (HOSPITAL_COMMUNITY): Payer: Self-pay

## 2023-09-10 DIAGNOSIS — Z1231 Encounter for screening mammogram for malignant neoplasm of breast: Secondary | ICD-10-CM | POA: Insufficient documentation

## 2023-09-16 ENCOUNTER — Encounter: Payer: Self-pay | Admitting: Obstetrics and Gynecology

## 2023-09-16 DIAGNOSIS — R9389 Abnormal findings on diagnostic imaging of other specified body structures: Secondary | ICD-10-CM | POA: Insufficient documentation

## 2023-09-20 LAB — GENECONNECT MOLECULAR SCREEN: Genetic Analysis Overall Interpretation: NEGATIVE

## 2023-10-07 ENCOUNTER — Encounter: Payer: Self-pay | Admitting: Obstetrics & Gynecology

## 2023-10-07 ENCOUNTER — Ambulatory Visit: Admitting: Obstetrics & Gynecology

## 2023-10-07 ENCOUNTER — Other Ambulatory Visit (HOSPITAL_COMMUNITY)
Admission: RE | Admit: 2023-10-07 | Discharge: 2023-10-07 | Disposition: A | Discharge status: Home / Self Care | Source: Ambulatory Visit | Attending: Obstetrics & Gynecology | Admitting: Obstetrics & Gynecology

## 2023-10-07 VITALS — BP 118/79 | HR 71 | Ht 66.0 in | Wt 200.6 lb

## 2023-10-07 DIAGNOSIS — N9489 Other specified conditions associated with female genital organs and menstrual cycle: Secondary | ICD-10-CM

## 2023-10-07 DIAGNOSIS — R9389 Abnormal findings on diagnostic imaging of other specified body structures: Secondary | ICD-10-CM

## 2023-10-07 DIAGNOSIS — N950 Postmenopausal bleeding: Secondary | ICD-10-CM | POA: Insufficient documentation

## 2023-10-07 NOTE — Progress Notes (Signed)
 GYN VISIT Patient name: Jennifer Cobb MRN [id number]  Date of birth: Dec 30, 1970 Chief Complaint:   endometrial biopsy  History of Present Illness:   Jennifer Cobb is a 53 y.o. [phone] PM female being seen today for follow up regarding:  PMB: Patient was seen by S.Brown on 8/5 where she reported that about 7 months ago she had intermittent bleeding.  Today she notes bleeding has decreased significantly.  Used to be bleeding or psotting everyday and now it's spotting every other day and no more period bleeding.   Prior to this her last menses was in 2017  Denies pelvic or abdominal pain.  Denies vaginal itching or irritation.  Denies GI or GU issues.  She reports no other acute complaints or concerns  Pelvic US  09/08/2023: 9 x 4.5 x 5.2 cm uterus.  No fibroids or masses seen.  Endometrial thickness 19 mm.  3.2 x 1.3 x 2.2 echogenic mass noted.  Ovaries not visualized bilaterally  Patient's last menstrual period was 09/02/2015 (exact date).    Review of Systems:   Pertinent items are noted in HPI Denies fever/chills, dizziness, headaches, visual disturbances, fatigue, shortness of breath, chest pain, abdominal pain, vomiting Pertinent History Reviewed:   Past Surgical History:  Procedure Laterality Date   COLONOSCOPY     COLONOSCOPY WITH PROPOFOL  N/A 04/23/2022   Procedure: COLONOSCOPY WITH PROPOFOL ;  Surgeon: Cindie Carlin POUR, DO;  Location: [facility];  Service: Gastroenterology;  Laterality: N/A;  9;00 am, LM to see if pt can move up   EYE SURGERY Left    ORIF WRIST FRACTURE Right 08/12/2020   Procedure: Right distal radius open reduction and internal fixation with repair as indicated;  Surgeon: Shari Easter, MD;  Location: [facility] OR;  Service: Orthopedics;  Laterality: Right;     Past Medical History:  Diagnosis Date   Anemia    Arthritis    Celiac disease    Reviewed problem list, medications and allergies. Physical Assessment:   Vitals:   10/07/23 0847  BP:  118/79  Pulse: 71  Weight: 200 lb 9.6 oz (91 kg)  Height: 5' 6 (1.676 m)  Body mass index is 32.38 kg/m.       Physical Examination:   General appearance: alert, well appearing, and in no distress  Psych: mood appropriate, normal affect  Skin: warm & dry   Cardiovascular: normal heart rate noted  Respiratory: normal respiratory effort, no distress  Abdomen: soft, non-tender, no rebound, no guarding  Pelvic: VULVA: normal appearing vulva with no masses, tenderness or lesions, VAGINA: normal appearing vagina with normal color and discharge, no lesions, CERVIX: normal appearing cervix without discharge or lesions, UTERUS: uterus is normal size, shape, consistency and nontender.  No adnexal masses or tenderness appreciated  Extremities: no edema   Endometrial Biopsy Procedure Note  Pre-operative Diagnosis: PMB, endometrial mass  Post-operative Diagnosis: same  Procedure Details  The risks (including infection, bleeding, pain, and uterine perforation) and benefits of the procedure were explained to the patient and Written informed consent was obtained.  Antibiotic prophylaxis against endocarditis was not indicated.   The patient was placed in the dorsal lithotomy position.  Bimanual exam showed the uterus to be in the neutral position.  A speculum inserted in the vagina, and the cervix prepped with betadine.     A single tooth tenaculum was applied to the anterior lip of the cervix for stabilization.  Os finder was used.  A Pipelle - aspirator was used to  sample the endometrium.  Sample was sent for pathologic examination.  Condition: Stable  Complications: None  Chaperone: Aleck Blase    Assessment & Plan:  1) postmenopausal bleeding -Reviewed ultrasound findings which showed both thickened lining and potential endometrial mass - Discussed potential etiology such as endometrial polyp or most concerning endometrial carcinoma - Recommendation for EMB, which was completed,  see above - Discussed my concern for potential endometrial carcinoma, await pathology for next step The patient was advised to call for any fever or for prolonged or severe pain or bleeding.   No orders of the defined types were placed in this encounter.   TBD   Berry Gallacher, DO Attending Obstetrician & Gynecologist, [facility] for [employer], [facility]

## 2023-10-08 ENCOUNTER — Other Ambulatory Visit: Payer: Self-pay | Admitting: Obstetrics & Gynecology

## 2023-10-08 DIAGNOSIS — C55 Malignant neoplasm of uterus, part unspecified: Secondary | ICD-10-CM

## 2023-10-08 NOTE — Progress Notes (Signed)
 Tried to call patient unable to leave message Spoke to husband and will call later this evening after 5P Referral to GYN sent

## 2023-10-09 ENCOUNTER — Telehealth: Payer: Self-pay | Admitting: *Deleted

## 2023-10-09 ENCOUNTER — Encounter: Payer: Self-pay | Admitting: Gynecologic Oncology

## 2023-10-09 ENCOUNTER — Telehealth: Payer: Self-pay

## 2023-10-09 LAB — SURGICAL PATHOLOGY

## 2023-10-09 NOTE — Telephone Encounter (Signed)
 LVM for Ms.Donze to call office regarding a new patient referral our office received from Dr.Ozan. First available is with Dr.Tucker on 9/12 or with Dr.Newton on 9/15.

## 2023-10-09 NOTE — H&P (View-Only) (Signed)
 GYNECOLOGIC ONCOLOGY NEW PATIENT CONSULTATION   Patient Name: Jennifer Cobb  Patient Age: 53 y.o. Date of Service: 10/10/23 Referring Provider: Jenny Ozan, MD  Primary Care Provider: Alphonsa Glendia LABOR, MD Consulting Provider: Comer Dollar, MD   Assessment/Plan:  Postmenopausal patient with clinical stage I low-grade endometrial cancer.  We reviewed the nature of endometrial cancer and its recommended surgical staging, including total hysterectomy, bilateral salpingo-oophorectomy, and lymph node assessment. The patient is a suitable candidate for staging via a minimally invasive approach to surgery.  We reviewed that robotic assistance would be used to complete the surgery.   We discussed that most endometrial cancer is detected early and that decisions regarding adjuvant therapy will be made based on her final pathology.   We reviewed the sentinel lymph node technique. Risks and benefits of sentinel lymph node biopsy was reviewed. We reviewed the technique and ICG dye. The patient DOES NOT have an iodine allergy or known liver dysfunction. We reviewed the false negative rate (0.4%), and that 3% of patients with metastatic disease will not have it detected by SLN biopsy in endometrial cancer. A low risk of allergic reaction to the dye, <0.2% for ICG, has been reported. We also discussed that in the case of failed mapping, which occurs 40% of the time, a bilateral or unilateral lymphadenectomy will be performed at the surgeon's discretion.   Potential benefits of sentinel nodes including a higher detection rate for metastasis due to ultrastaging and potential reduction in operative morbidity. However, there remains uncertainty as to the role for treatment of micrometastatic disease. Further, the benefit of operative morbidity associated with the SLN technique in endometrial cancer is not yet completely known. In other patient populations (e.g. the cervical cancer population) there has been  observed reductions in morbidity with SLN biopsy compared to pelvic lymphadenectomy. Lymphedema, nerve dysfunction and lymphocysts are all potential risks with the SLN technique as with complete lymphadenectomy. Additional risks to the patient include the risk of damage to an internal organ while operating in an altered view (e.g. the black and white image of the robotic fluorescence imaging mode).   We discussed the plan for a robotic assisted hysterectomy, bilateral salpingo-oophorectomy, sentinel lymph node evaluation, possible lymph node dissection, possible laparotomy. The risks of surgery were discussed in detail and she understands these to include infection; wound separation; hernia; vaginal cuff separation, injury to adjacent organs such as bowel, bladder, blood vessels, ureters and nerves; bleeding which may require blood transfusion; anesthesia risk; thromboembolic events; possible death; unforeseen complications; possible need for re-exploration; medical complications such as heart attack, stroke, pleural effusion and pneumonia; and, if full lymphadenectomy is performed the risk of lymphedema and lymphocyst. The patient will receive DVT and antibiotic prophylaxis as indicated. She voiced a clear understanding. She had the opportunity to ask questions. Perioperative instructions were reviewed with her. Prescriptions for post-op medications were sent to her pharmacy of choice.  A copy of this note was sent to the patient's referring provider.   70 minutes of total time was spent for this patient encounter, including preparation, face-to-face counseling with the patient and coordination of care, and documentation of the encounter.  Comer Dollar, MD  Division of Gynecologic Oncology  Department of Obstetrics and Gynecology  [facility]  ___________________________________________  Chief Complaint: No chief complaint on file.   History of Present Illness:   Jennifer Cobb is a 53 y.o. y.o. female who is seen in consultation at the request of Dr. Ozan  for an evaluation of endometrial cancer.  Patient was seen initially in early August for a routine annual exam.  At that time, she endorsed menopause in 2017 with intermittent postmenopausal bleeding for the last 6 months, sometimes heavy and sometimes light. Pelvic ultrasound exam was performed on 8/19 showing a uterus measuring 9 x 4.5 x 5.2 cm with endometrium thickened at 19 mm and an echogenic mass measuring up to 3.2 cm within the endometrium.  Ovaries were not visualized. The patient underwent endometrial biopsy on 10/07/2023.  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  Patient presents with her husband today.  Reports doing well.  Bleeding slightly increased since her biopsy.  She describes bleeding as daily, typically filling 1 pad, either a panty liner or sometimes a thicker pad.  Has intermittent cramping with the bleeding.  She endorses normal bowel and bladder function.  Reports having a good appetite without nausea or emesis.  Denies any recent weight changes.  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  PAST MEDICAL HISTORY:  Past Medical History:  Diagnosis Date   Anemia    Arthritis    Celiac disease      PAST SURGICAL HISTORY:  Past Surgical History:  Procedure Laterality Date   COLONOSCOPY     COLONOSCOPY WITH PROPOFOL  N/A 04/23/2022   Procedure: COLONOSCOPY WITH PROPOFOL ;  Surgeon: Cindie Carlin POUR, DO;  Location: [facility];  Service: Gastroenterology;  Laterality: N/A;  9;00 am, LM to see if pt can move up   EYE SURGERY Left    ORIF WRIST FRACTURE Right 08/12/2020   Procedure: Right distal radius open reduction and internal fixation with repair as indicated;  Surgeon: Shari Easter, MD;  Location: [facility];  Service: Orthopedics;  Laterality: Right;      OB/GYN HISTORY:  OB History  Gravida Para Term Preterm AB Living  3 3 2 1  3   SAB IAB Ectopic Multiple Live Births      3    # Outcome Date GA Lbr Len/2nd Weight Sex Type Anes PTL Lv  3 Preterm 04/18/00 [redacted]w[redacted]d  6 lb 12 oz (3.062 kg) M Vag-Spont   LIV  2 Term 01/01/98 [redacted]w[redacted]d  9 lb 5 oz (4.224 kg) M Vag-Spont   LIV  1 Term 12/31/95 [redacted]w[redacted]d  7 lb 14 oz (3.572 kg) F Vag-Spont   LIV    Patient's last menstrual period was 09/02/2015 (exact date).  Age at menarche: 49  Age at menopause: 36 Hx of HRT: denies Hx of STDs: denies Last pap: 08/2023 - NILM, HR HPV negative History of abnormal pap smears: denies  SCREENING STUDIES:  Last mammogram: 2025  Last colonoscopy: 2024  MEDICATIONS: Outpatient Encounter Medications as of 10/10/2023  Medication Sig   acetaminophen  (TYLENOL ) 500 MG tablet Take 500-1,000 mg by mouth every 6 (six) hours as needed (pain.).   Cyanocobalamin (VITAMIN B-12 PO) Take 1 tablet by mouth in the morning.   cyclobenzaprine  (FLEXERIL ) 5 MG tablet Take 5 mg by mouth 3 (three) times daily as needed for muscle spasms.   ferrous sulfate 325 (65 FE) MG tablet Take 325 mg by mouth daily with breakfast.   FOLIC ACID PO Take 1 tablet by mouth in the morning.   loratadine (CLARITIN) 10 MG tablet Take 10 mg by mouth in the morning.   sertraline  (ZOLOFT ) 50 MG tablet TAKE ONE (1) TABLET BY MOUTH  EVERY DAY   Soft Lens Products (B & L SENSITIVE EYES SALINE) 0.4 % SOLN Place 1 drop into both eyes in the morning.   TURMERIC PO Take 1 capsule by mouth daily.   BIOTIN PO Take 1 capsule by mouth in the morning. (Patient not taking: Reported on 10/09/2023)   No facility-administered encounter medications on file as of 10/10/2023.    ALLERGIES:  Allergies  Allergen Reactions   Gluten Meal Diarrhea and Nausea And Vomiting     FAMILY HISTORY:  Family History  Problem Relation Age of Onset   Colon cancer Mother    Cancer Mother 96       colon   Bladder Cancer Father     Hypertension Father    Syncope episode Daughter    Crohn's disease Daughter    Autism Son    Breast cancer Neg Hx    Ovarian cancer Neg Hx    Endometrial cancer Neg Hx    Pancreatic cancer Neg Hx    Prostate cancer Neg Hx      SOCIAL HISTORY:  Social Connections: Socially Integrated (09/02/2023)   Social Connection and Isolation Panel    Frequency of Communication with Friends and Family: More than three times a week    Frequency of Social Gatherings with Friends and Family: Twice a week    Attends Religious Services: More than 4 times per year    Active Member of [employer] or Organizations: Yes    Attends Engineer, structural: More than 4 times per year    Marital Status: Married    REVIEW OF SYSTEMS:  Denies appetite changes, fevers, chills, fatigue, unexplained weight changes. Denies hearing loss, neck lumps or masses, mouth sores, ringing in ears or voice changes. Denies cough or wheezing.  Denies shortness of breath. Denies chest pain or palpitations. Denies leg swelling. Denies abdominal distention, pain, blood in stools, constipation, diarrhea, nausea, vomiting, or early satiety. Denies pain with intercourse, dysuria, frequency, hematuria or incontinence. Denies hot flashes, pelvic pain, vaginal bleeding or vaginal discharge.   Denies joint pain, back pain or muscle pain/cramps. Denies itching, rash, or wounds. Denies dizziness, headaches, numbness or seizures. Denies swollen lymph nodes or glands, denies easy bruising or bleeding. Denies anxiety, depression, confusion, or decreased concentration.  Physical Exam:  Vital Signs for this encounter:  Blood pressure 131/75, pulse 87, temperature 98.8 F (37.1 C), resp. rate 19, height 5' 5 (1.651 m), weight 201 lb 3.2 oz (91.3 kg), last menstrual period 09/02/2015, SpO2 100%. Body mass index is 33.48 kg/m. General: Alert, oriented, no acute distress.  HEENT: Normocephalic, atraumatic. Sclera anicteric.  Chest: Clear  to auscultation bilaterally. No wheezes, rhonchi, or rales. Cardiovascular: Regular rate and rhythm, no murmurs, rubs, or gallops.  Abdomen: Normoactive bowel sounds. Soft, nondistended, nontender to palpation. No masses or hepatosplenomegaly appreciated. No palpable fluid wave.  Extremities: Grossly normal range of motion. Warm, well perfused. No edema bilaterally.  Skin: No rashes or lesions.  Lymphatics: No cervical, supraclavicular, or inguinal adenopathy.  GU:  Normal external female genitalia. No lesions. No discharge or bleeding.             Bladder/urethra:  No lesions or masses, well supported bladder             Vagina: Well-rugated, no lesions.             Cervix: Normal appearing, no lesions.             Uterus: Small, mobile, no  parametrial involvement or nodularity.             Adnexa: No masses appreciated.  Rectal: Deferred.  LABORATORY AND RADIOLOGIC DATA:  Outside medical records were reviewed to synthesize the above history, along with the history and physical obtained during the visit.   Lab Results  Component Value Date   WBC 8.2 08/12/2020   HGB 14.5 08/12/2020   HCT 44.7 08/12/2020   PLT 385 08/12/2020

## 2023-10-09 NOTE — Progress Notes (Unsigned)
 GYNECOLOGIC ONCOLOGY NEW PATIENT CONSULTATION   Patient Name: Jennifer Cobb  Patient Age: 53 y.o. Date of Service: 10/10/23 Referring Provider: Jenny Ozan, MD  Primary Care Provider: Alphonsa Glendia LABOR, MD Consulting Provider: Comer Dollar, MD   Assessment/Plan:  Postmenopausal patient with clinical stage I low-grade endometrial cancer.  We reviewed the nature of endometrial cancer and its recommended surgical staging, including total hysterectomy, bilateral salpingo-oophorectomy, and lymph node assessment. The patient is a suitable candidate for staging via a minimally invasive approach to surgery.  We reviewed that robotic assistance would be used to complete the surgery.   We discussed that most endometrial cancer is detected early and that decisions regarding adjuvant therapy will be made based on her final pathology.   We reviewed the sentinel lymph node technique. Risks and benefits of sentinel lymph node biopsy was reviewed. We reviewed the technique and ICG dye. The patient DOES NOT have an iodine allergy or known liver dysfunction. We reviewed the false negative rate (0.4%), and that 3% of patients with metastatic disease will not have it detected by SLN biopsy in endometrial cancer. A low risk of allergic reaction to the dye, <0.2% for ICG, has been reported. We also discussed that in the case of failed mapping, which occurs 40% of the time, a bilateral or unilateral lymphadenectomy will be performed at the surgeon's discretion.   Potential benefits of sentinel nodes including a higher detection rate for metastasis due to ultrastaging and potential reduction in operative morbidity. However, there remains uncertainty as to the role for treatment of micrometastatic disease. Further, the benefit of operative morbidity associated with the SLN technique in endometrial cancer is not yet completely known. In other patient populations (e.g. the cervical cancer population) there has been  observed reductions in morbidity with SLN biopsy compared to pelvic lymphadenectomy. Lymphedema, nerve dysfunction and lymphocysts are all potential risks with the SLN technique as with complete lymphadenectomy. Additional risks to the patient include the risk of damage to an internal organ while operating in an altered view (e.g. the black and white image of the robotic fluorescence imaging mode).   We discussed the plan for a robotic assisted hysterectomy, bilateral salpingo-oophorectomy, sentinel lymph node evaluation, possible lymph node dissection, possible laparotomy. The risks of surgery were discussed in detail and she understands these to include infection; wound separation; hernia; vaginal cuff separation, injury to adjacent organs such as bowel, bladder, blood vessels, ureters and nerves; bleeding which may require blood transfusion; anesthesia risk; thromboembolic events; possible death; unforeseen complications; possible need for re-exploration; medical complications such as heart attack, stroke, pleural effusion and pneumonia; and, if full lymphadenectomy is performed the risk of lymphedema and lymphocyst. The patient will receive DVT and antibiotic prophylaxis as indicated. She voiced a clear understanding. She had the opportunity to ask questions. Perioperative instructions were reviewed with her. Prescriptions for post-op medications were sent to her pharmacy of choice.  A copy of this note was sent to the patient's referring provider.   70 minutes of total time was spent for this patient encounter, including preparation, face-to-face counseling with the patient and coordination of care, and documentation of the encounter.  Comer Dollar, MD  Division of Gynecologic Oncology  Department of Obstetrics and Gynecology  [facility]  ___________________________________________  Chief Complaint: No chief complaint on file.   History of Present Illness:   NESIAH Cobb is a 53 y.o. y.o. female who is seen in consultation at the request of Dr. Ozan  for an evaluation of endometrial cancer.  Patient was seen initially in early August for a routine annual exam.  At that time, she endorsed menopause in 2017 with intermittent postmenopausal bleeding for the last 6 months, sometimes heavy and sometimes light. Pelvic ultrasound exam was performed on 8/19 showing a uterus measuring 9 x 4.5 x 5.2 cm with endometrium thickened at 19 mm and an echogenic mass measuring up to 3.2 cm within the endometrium.  Ovaries were not visualized. The patient underwent endometrial biopsy on 10/07/2023.  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  Patient presents with her husband today.  Reports doing well.  Bleeding slightly increased since her biopsy.  She describes bleeding as daily, typically filling 1 pad, either a panty liner or sometimes a thicker pad.  Has intermittent cramping with the bleeding.  She endorses normal bowel and bladder function.  Reports having a good appetite without nausea or emesis.  Denies any recent weight changes.  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  PAST MEDICAL HISTORY:  Past Medical History:  Diagnosis Date   Anemia    Arthritis    Celiac disease      PAST SURGICAL HISTORY:  Past Surgical History:  Procedure Laterality Date   COLONOSCOPY     COLONOSCOPY WITH PROPOFOL  N/A 04/23/2022   Procedure: COLONOSCOPY WITH PROPOFOL ;  Surgeon: Cindie Carlin POUR, DO;  Location: [facility];  Service: Gastroenterology;  Laterality: N/A;  9;00 am, LM to see if pt can move up   EYE SURGERY Left    ORIF WRIST FRACTURE Right 08/12/2020   Procedure: Right distal radius open reduction and internal fixation with repair as indicated;  Surgeon: Shari Easter, MD;  Location: [facility];  Service: Orthopedics;  Laterality: Right;      OB/GYN HISTORY:  OB History  Gravida Para Term Preterm AB Living  3 3 2 1  3   SAB IAB Ectopic Multiple Live Births      3    # Outcome Date GA Lbr Len/2nd Weight Sex Type Anes PTL Lv  3 Preterm 04/18/00 [redacted]w[redacted]d  6 lb 12 oz (3.062 kg) M Vag-Spont   LIV  2 Term 01/01/98 [redacted]w[redacted]d  9 lb 5 oz (4.224 kg) M Vag-Spont   LIV  1 Term 12/31/95 [redacted]w[redacted]d  7 lb 14 oz (3.572 kg) F Vag-Spont   LIV    Patient's last menstrual period was 09/02/2015 (exact date).  Age at menarche: 49  Age at menopause: 36 Hx of HRT: denies Hx of STDs: denies Last pap: 08/2023 - NILM, HR HPV negative History of abnormal pap smears: denies  SCREENING STUDIES:  Last mammogram: 2025  Last colonoscopy: 2024  MEDICATIONS: Outpatient Encounter Medications as of 10/10/2023  Medication Sig   acetaminophen  (TYLENOL ) 500 MG tablet Take 500-1,000 mg by mouth every 6 (six) hours as needed (pain.).   Cyanocobalamin (VITAMIN B-12 PO) Take 1 tablet by mouth in the morning.   cyclobenzaprine  (FLEXERIL ) 5 MG tablet Take 5 mg by mouth 3 (three) times daily as needed for muscle spasms.   ferrous sulfate 325 (65 FE) MG tablet Take 325 mg by mouth daily with breakfast.   FOLIC ACID PO Take 1 tablet by mouth in the morning.   loratadine (CLARITIN) 10 MG tablet Take 10 mg by mouth in the morning.   sertraline  (ZOLOFT ) 50 MG tablet TAKE ONE (1) TABLET BY MOUTH  EVERY DAY   Soft Lens Products (B & L SENSITIVE EYES SALINE) 0.4 % SOLN Place 1 drop into both eyes in the morning.   TURMERIC PO Take 1 capsule by mouth daily.   BIOTIN PO Take 1 capsule by mouth in the morning. (Patient not taking: Reported on 10/09/2023)   No facility-administered encounter medications on file as of 10/10/2023.    ALLERGIES:  Allergies  Allergen Reactions   Gluten Meal Diarrhea and Nausea And Vomiting     FAMILY HISTORY:  Family History  Problem Relation Age of Onset   Colon cancer Mother    Cancer Mother 96       colon   Bladder Cancer Father     Hypertension Father    Syncope episode Daughter    Crohn's disease Daughter    Autism Son    Breast cancer Neg Hx    Ovarian cancer Neg Hx    Endometrial cancer Neg Hx    Pancreatic cancer Neg Hx    Prostate cancer Neg Hx      SOCIAL HISTORY:  Social Connections: Socially Integrated (09/02/2023)   Social Connection and Isolation Panel    Frequency of Communication with Friends and Family: More than three times a week    Frequency of Social Gatherings with Friends and Family: Twice a week    Attends Religious Services: More than 4 times per year    Active Member of [employer] or Organizations: Yes    Attends Engineer, structural: More than 4 times per year    Marital Status: Married    REVIEW OF SYSTEMS:  Denies appetite changes, fevers, chills, fatigue, unexplained weight changes. Denies hearing loss, neck lumps or masses, mouth sores, ringing in ears or voice changes. Denies cough or wheezing.  Denies shortness of breath. Denies chest pain or palpitations. Denies leg swelling. Denies abdominal distention, pain, blood in stools, constipation, diarrhea, nausea, vomiting, or early satiety. Denies pain with intercourse, dysuria, frequency, hematuria or incontinence. Denies hot flashes, pelvic pain, vaginal bleeding or vaginal discharge.   Denies joint pain, back pain or muscle pain/cramps. Denies itching, rash, or wounds. Denies dizziness, headaches, numbness or seizures. Denies swollen lymph nodes or glands, denies easy bruising or bleeding. Denies anxiety, depression, confusion, or decreased concentration.  Physical Exam:  Vital Signs for this encounter:  Blood pressure 131/75, pulse 87, temperature 98.8 F (37.1 C), resp. rate 19, height 5' 5 (1.651 m), weight 201 lb 3.2 oz (91.3 kg), last menstrual period 09/02/2015, SpO2 100%. Body mass index is 33.48 kg/m. General: Alert, oriented, no acute distress.  HEENT: Normocephalic, atraumatic. Sclera anicteric.  Chest: Clear  to auscultation bilaterally. No wheezes, rhonchi, or rales. Cardiovascular: Regular rate and rhythm, no murmurs, rubs, or gallops.  Abdomen: Normoactive bowel sounds. Soft, nondistended, nontender to palpation. No masses or hepatosplenomegaly appreciated. No palpable fluid wave.  Extremities: Grossly normal range of motion. Warm, well perfused. No edema bilaterally.  Skin: No rashes or lesions.  Lymphatics: No cervical, supraclavicular, or inguinal adenopathy.  GU:  Normal external female genitalia. No lesions. No discharge or bleeding.             Bladder/urethra:  No lesions or masses, well supported bladder             Vagina: Well-rugated, no lesions.             Cervix: Normal appearing, no lesions.             Uterus: Small, mobile, no  parametrial involvement or nodularity.             Adnexa: No masses appreciated.  Rectal: Deferred.  LABORATORY AND RADIOLOGIC DATA:  Outside medical records were reviewed to synthesize the above history, along with the history and physical obtained during the visit.   Lab Results  Component Value Date   WBC 8.2 08/12/2020   HGB 14.5 08/12/2020   HCT 44.7 08/12/2020   PLT 385 08/12/2020

## 2023-10-09 NOTE — Telephone Encounter (Signed)
 Spoke with Ms. Westerfield regarding her referral to GYN oncology. She has an appointment scheduled with Dr. Viktoria on 10/10/23 at 0945 with 0915 check in time. Patient agrees to date and time. She has been provided with office address and location. She is also aware of our mask and visitor policy. Patient verbalized understanding and will call with any questions.

## 2023-10-10 ENCOUNTER — Inpatient Hospital Stay: Attending: Gynecologic Oncology | Admitting: Gynecologic Oncology

## 2023-10-10 ENCOUNTER — Inpatient Hospital Stay: Admitting: Gynecologic Oncology

## 2023-10-10 ENCOUNTER — Encounter: Payer: Self-pay | Admitting: Gynecologic Oncology

## 2023-10-10 VITALS — BP 131/75 | HR 87 | Temp 98.8°F | Resp 19 | Ht 65.0 in | Wt 201.2 lb

## 2023-10-10 DIAGNOSIS — Z8052 Family history of malignant neoplasm of bladder: Secondary | ICD-10-CM | POA: Diagnosis not present

## 2023-10-10 DIAGNOSIS — C541 Malignant neoplasm of endometrium: Secondary | ICD-10-CM | POA: Diagnosis not present

## 2023-10-10 DIAGNOSIS — Z800 Family history of malignant neoplasm of digestive organs: Secondary | ICD-10-CM | POA: Diagnosis not present

## 2023-10-10 DIAGNOSIS — M1990 Unspecified osteoarthritis, unspecified site: Secondary | ICD-10-CM | POA: Insufficient documentation

## 2023-10-10 DIAGNOSIS — Z79899 Other long term (current) drug therapy: Secondary | ICD-10-CM | POA: Insufficient documentation

## 2023-10-10 DIAGNOSIS — N950 Postmenopausal bleeding: Secondary | ICD-10-CM | POA: Insufficient documentation

## 2023-10-10 DIAGNOSIS — K900 Celiac disease: Secondary | ICD-10-CM | POA: Insufficient documentation

## 2023-10-10 MED ORDER — TRAMADOL HCL 50 MG PO TABS: 50.0000 mg | ORAL_TABLET | Freq: Four times a day (QID) | ORAL | 0 refills | Status: DC | PRN

## 2023-10-10 MED ORDER — SENNOSIDES-DOCUSATE SODIUM 8.6-50 MG PO TABS: 2.0000 | ORAL_TABLET | Freq: Every day | ORAL | 0 refills | Status: DC

## 2023-10-10 NOTE — Progress Notes (Signed)
 Patient here for a consult with Dr. Viktoria and for a pre-operative appointment prior to her scheduled surgery on 10/23/2023. She is scheduled for a robotic assisted total laparoscopic hysterectomy, bilateral salpingo-oophorectomy, sentinel lymph node biopsy, possible lymph node dissection, possible laparotomy. The surgery was discussed in detail.  See after visit summary for additional details.       Discussed post-op pain management in detail including the aspects of the enhanced recovery pathway.  Advised her that a new prescription would be sent in for Tramadol  and it is only to be used for after her upcoming surgery.  We discussed the use of tylenol  post-op and to monitor for a maximum of 4,000 mg in a 24 hour period.  Also let her know that sennakot will be prescribed to be used after surgery and to hold if having loose stools.  Discussed bowel regimen in detail.     Discussed the use of SCDs and measures to take at home to prevent DVT including frequent mobility.  Reportable signs and symptoms of DVT discussed. Post-operative instructions discussed and expectations for after surgery. Incisional care discussed as well including reportable signs and symptoms including erythema, drainage, wound separation.     30 minutes spent with the patient.  Verbalizing understanding of material discussed. No needs or concerns voiced at the end of the visit.   Advised patient to call for any needs.  Advised that her post-operative medications had been prescribed and could be picked up at any time.    This appointment is included in the global surgical bundle as pre-operative teaching and has no charge.

## 2023-10-10 NOTE — Patient Instructions (Signed)
 Preparing for your Surgery  Plan for surgery on October 23, 2023 with Dr. Comer Dollar at [facility]. You will be scheduled for robotic assisted total laparoscopic hysterectomy (removal of the uterus and cervix), bilateral salpingo-oophorectomy (removal of both ovaries and fallopian tubes), sentinel lymph node biopsy, possible lymph node dissection, possible laparotomy (larger incision on your abdomen if needed).   Pre-operative Testing -You will receive a phone call from presurgical testing at [facility] to arrange for a pre-operative appointment and lab work.  -Bring your insurance card, copy of an advanced directive if applicable, medication list  -At that visit, you will be asked to sign a consent for a possible blood transfusion in case a transfusion becomes necessary during surgery.  The need for a blood transfusion is rare but having consent is a necessary part of your care.     -You should not be taking blood thinners or aspirin at least ten days prior to surgery unless instructed by your surgeon.  -Do not take supplements such as fish oil (omega 3), red yeast rice, turmeric before your surgery. STOP TAKING AT LEAST 10 DAYS BEFORE SURGERY. You want to avoid medications with aspirin in them including headache powders such as BC or Goody's), Excedrin migraine.  -If you are taking a GLP-1 medication/injection such as Ozempic, Mounjaro, [id number], this needs to be held before surgery for at least 7 days before.  Day Before Surgery at Home -You will be asked to take in a light diet the day before surgery. You will be advised you can have clear liquids up until 3 hours before your surgery.    Eat a light diet the day before surgery.  Examples including soups, broths, toast, yogurt, mashed potatoes.  AVOID GAS PRODUCING FOODS AND BEVERAGES. Things to avoid include carbonated beverages (fizzy beverages, sodas), raw fruits and raw vegetables (uncooked), or beans.   If your  bowels are filled with gas, your surgeon will have difficulty visualizing your pelvic organs which increases your surgical risks.  Your role in recovery Your role is to become active as soon as directed by your doctor, while still giving yourself time to heal.  Rest when you feel tired. You will be asked to do the following in order to speed your recovery:  - Cough and breathe deeply. This helps to clear and expand your lungs and can prevent pneumonia after surgery.  - STAY ACTIVE WHEN YOU GET HOME. Do mild physical activity. Walking or moving your legs help your circulation and body functions return to normal. Do not try to get up or walk alone the first time after surgery.   -If you develop swelling on one leg or the other, pain in the back of your leg, redness/warmth in one of your legs, please call the office or go to the Emergency Room to have a doppler to rule out a blood clot. For shortness of breath, chest pain-seek care in the Emergency Room as soon as possible. - Actively manage your pain. Managing your pain lets you move in comfort. We will ask you to rate your pain on a scale of zero to 10. It is your responsibility to tell your doctor or nurse where and how much you hurt so your pain can be treated.  Special Considerations -If you are diabetic, you may be placed on insulin after surgery to have closer control over your blood sugars to promote healing and recovery.  This does not mean that you will be discharged on  insulin.  If applicable, your oral antidiabetics will be resumed when you are tolerating a solid diet.  -Your final pathology results from surgery should be available around one week after surgery and the results will be relayed to you when available.  -FMLA forms can be faxed to [phone] and please allow 5-7 business days for completion.  Pain Management After Surgery -You will be prescribed your pain medication and bowel regimen medications before surgery so that you  can have these available when you are discharged from the hospital. The pain medication is for use ONLY AFTER surgery and a new prescription will not be given.   -Make sure that you have Tylenol  and Ibuprofen IF YOU ARE ABLE TO TAKE THESE MEDICATIONS at home to use on a regular basis after surgery for pain control. We recommend alternating the medications every hour to six hours since they work differently and are processed in the body differently for pain relief.  -Review the attached handout on narcotic use and their risks and side effects.   Bowel Regimen -You will be prescribed Sennakot-S to take nightly to prevent constipation especially if you are taking the narcotic pain medication intermittently.  It is important to prevent constipation and drink adequate amounts of liquids. You can stop taking this medication when you are not taking pain medication and you are back on your normal bowel routine.  Risks of Surgery Risks of surgery are low but include bleeding, infection, damage to surrounding structures, re-operation, blood clots, and very rarely death.   Blood Transfusion Information (For the consent to be signed before surgery)  We will be checking your blood type before surgery so in case of emergencies, we will know what type of blood you would need.                                            WHAT IS A BLOOD TRANSFUSION?  A transfusion is the replacement of blood or some of its parts. Blood is made up of multiple cells which provide different functions. Red blood cells carry oxygen and are used for blood loss replacement. White blood cells fight against infection. Platelets control bleeding. Plasma helps clot blood. Other blood products are available for specialized needs, such as hemophilia or other clotting disorders. BEFORE THE TRANSFUSION  Who gives blood for transfusions?  You may be able to donate blood to be used at a later date on yourself (autologous  donation). Relatives can be asked to donate blood. This is generally not any safer than if you have received blood from a stranger. The same precautions are taken to ensure safety when a relative's blood is donated. Healthy volunteers who are fully evaluated to make sure their blood is safe. This is blood bank blood. Transfusion therapy is the safest it has ever been in the practice of medicine. Before blood is taken from a donor, a complete history is taken to make sure that person has no history of diseases nor engages in risky social behavior (examples are intravenous drug use or sexual activity with multiple partners). The donor's travel history is screened to minimize risk of transmitting infections, such as malaria. The donated blood is tested for signs of infectious diseases, such as HIV and hepatitis. The blood is then tested to be sure it is compatible with you in order to minimize the chance of a transfusion reaction.  If you or a relative donates blood, this is often done in anticipation of surgery and is not appropriate for emergency situations. It takes many days to process the donated blood. RISKS AND COMPLICATIONS Although transfusion therapy is very safe and saves many lives, the main dangers of transfusion include:  Getting an infectious disease. Developing a transfusion reaction. This is an allergic reaction to something in the blood you were given. Every precaution is taken to prevent this. The decision to have a blood transfusion has been considered carefully by your caregiver before blood is given. Blood is not given unless the benefits outweigh the risks.  AFTER SURGERY INSTRUCTIONS  Return to work: 4-6 weeks if applicable  Activity: 1. Be up and out of the bed during the day.  Take a nap if needed.  You may walk up steps but be careful and use the hand rail.  Stair climbing will tire you more than you think, you may need to stop part way and rest.   2. No lifting or straining  for 6 weeks over 10 pounds. No pushing, pulling, straining for 6 weeks.  3. No driving for 4-89 days when the following criteria have been met: Do not drive if you are taking narcotic pain medicine and make sure that your reaction time has returned.   4. You can shower as soon as the next day after surgery. Shower daily.  Use your regular soap and water  (not directly on the incision) and pat your incision(s) dry afterwards; don't rub.  No tub baths or submerging your body in water  until cleared by your surgeon. If you have the soap that was given to you by pre-surgical testing that was used before surgery, you do not need to use it afterwards because this can irritate your incisions.   5. No sexual activity and nothing in the vagina for 12 weeks.  6. You may experience a small amount of clear drainage from your incisions, which is normal.  If the drainage persists, increases, or changes color please call the office.  7. Do not use creams, lotions, or ointments such as neosporin on your incisions after surgery until advised by your surgeon because they can cause removal of the dermabond glue on your incisions.    8. You may experience vaginal spotting after surgery or when the stitches at the top of the vagina begin to dissolve.  The spotting is normal but if you experience heavy bleeding, call our office.  9. Take Tylenol  or ibuprofen first for pain if you are able to take these medications and only use narcotic pain medication for severe pain not relieved by the Tylenol  or Ibuprofen.  Monitor your Tylenol  intake to a max of 4,000 mg in a 24 hour period. You can alternate these medications after surgery.  Diet: 1. Low sodium Heart Healthy Diet is recommended but you are cleared to resume your normal (before surgery) diet after your procedure.  2. It is safe to use a laxative, such as Miralax or Colace, if you have difficulty moving your bowels before surgery. You have been prescribed Sennakot-S to  take at bedtime every evening after surgery to keep bowel movements regular and to prevent constipation.    Wound Care: 1. Keep clean and dry.  Shower daily.  Reasons to call the Doctor: Fever - Oral temperature greater than 100.4 degrees Fahrenheit Foul-smelling vaginal discharge Difficulty urinating Nausea and vomiting Increased pain at the site of the incision that is unrelieved with pain medicine.  Difficulty breathing with or without chest pain New calf pain especially if only on one side Sudden, continuing increased vaginal bleeding with or without clots.   Contacts: For questions or concerns you should contact:  Dr. Comer Dollar at [phone]  [name], NP at [phone]  After Hours: call [phone] and have the GYN Oncologist paged/contacted (after 5 pm or on the weekends). You will speak with an after hours RN and let he or she know you have had surgery.  Messages sent via mychart are for non-urgent matters and are not responded to after hours so for urgent needs, please call the after hours number.

## 2023-10-14 ENCOUNTER — Telehealth: Payer: Self-pay

## 2023-10-14 ENCOUNTER — Other Ambulatory Visit: Payer: Self-pay

## 2023-10-14 ENCOUNTER — Encounter (HOSPITAL_COMMUNITY): Payer: Self-pay | Admitting: Gynecologic Oncology

## 2023-10-14 NOTE — Patient Instructions (Signed)
 SURGICAL WAITING ROOM VISITATION  Patients having surgery or a procedure may have no more than 2 support people in the waiting area - these visitors may rotate.    Children under the age of 20 must have an adult with them who is not the patient.  Visitors with respiratory illnesses are discouraged from visiting and should remain at home.  If the patient needs to stay at the hospital during part of their recovery, the visitor guidelines for inpatient rooms apply. Pre-op nurse will coordinate an appropriate time for 1 support person to accompany patient in pre-op.  This support person may not rotate.    Please [facility] for the visitor guidelines for Inpatients (after your surgery is over and you are in a regular room).       Your procedure is scheduled on: Thursday, Sept. 18, 2025   Report to [facility] Main Entrance    Report to admitting at 12:30 PM   Call this number if you have problems the morning of surgery [phone]   Do not eat food :After Midnight.   After Midnight you may have the following liquids until 11:45 AM DAY OF SURGERY  Water  Non-Citrus Juices (without pulp, NO RED-Apple, White grape, White cranberry) Black Coffee (NO MILK/CREAM OR CREAMERS, sugar ok)  Clear Tea (NO MILK/CREAM OR CREAMERS, sugar ok) regular and decaf                             Plain Jell-O (NO RED)                                           Fruit ices (not with fruit pulp, NO RED)                                     Popsicles (NO RED)                                                               Sports drinks like Gatorade (NO RED)  FOLLOW BOWEL PREP AND ANY ADDITIONAL PRE OP INSTRUCTIONS YOU RECEIVED FROM YOUR SURGEON'S OFFICE!!!     Oral Hygiene is also important to reduce your risk of infection.                                    Remember - BRUSH YOUR TEETH THE MORNING OF SURGERY WITH YOUR REGULAR TOOTHPASTE  DENTURES WILL BE REMOVED PRIOR TO SURGERY  PLEASE DO NOT APPLY Poly grip OR ADHESIVES!!!   Do NOT smoke after Midnight   Stop all vitamins and herbal supplements 7 days before surgery.   Take these medicines the morning of surgery with A SIP OF WATER :  Loratadine Sertaline                              You may not have any metal on your body including hair pins, jewelry,  and body piercing             Do not wear make-up, lotions, powders, perfumes/cologne, or deodorant  Do not wear nail polish including gel and S&S, artificial/acrylic nails, or any other type of covering on natural nails including finger and toenails. If you have artificial nails, gel coating, etc. that needs to be removed by a nail salon please have this removed prior to surgery or surgery may need to be canceled/ delayed if the surgeon/ anesthesia feels like they are unable to be safely monitored.   Do not shave  48 hours prior to surgery.    Do not bring valuables to the hospital. [facility] IS NOT             RESPONSIBLE   FOR VALUABLES.   Contacts, glasses, dentures or bridgework may not be worn into surgery.   Bring small overnight bag day of surgery.   DO NOT BRING YOUR HOME MEDICATIONS TO THE HOSPITAL. PHARMACY WILL DISPENSE MEDICATIONS LISTED ON YOUR MEDICATION LIST TO YOU DURING YOUR ADMISSION IN THE HOSPITAL!    Patients discharged on the day of surgery will not be allowed to drive home.  Someone NEEDS to stay with you for the first 24 hours after anesthesia.   Special Instructions: Bring a copy of your healthcare power of attorney and living will documents the day of surgery if you haven't scanned them before.              Please read over the following fact sheets you were given: IF YOU HAVE QUESTIONS ABOUT YOUR PRE-OP INSTRUCTIONS PLEASE CALL [phone].   If you received a COVID test during your pre-op visit  it is requested that you wear a mask when out in public, stay away from anyone that may not be feeling well and notify your surgeon if  you develop symptoms. If you test positive for Covid or have been in contact with anyone that has tested positive in the last 10 days please notify you surgeon.    [facility] - Preparing for Surgery Before surgery, you can play an important role.  Because skin is not sterile, your skin needs to be as free of germs as possible.  You can reduce the number of germs on your skin by washing with CHG (chlorahexidine gluconate) soap before surgery.  CHG is an antiseptic cleaner which kills germs and bonds with the skin to continue killing germs even after washing. Please DO NOT use if you have an allergy to CHG or antibacterial soaps.  If your skin becomes reddened/irritated stop using the CHG and inform your nurse when you arrive at Short Stay. Do not shave (including legs and underarms) for at least 48 hours prior to the first CHG shower.  You may shave your face/neck.  Please follow these instructions carefully:  1.  Shower with CHG Soap the night before surgery and the  morning of surgery.  2.  If you choose to wash your hair, wash your hair first as usual with your normal  shampoo.  3.  After you shampoo, rinse your hair and body thoroughly to remove the shampoo.                             4.  Use CHG as you would any other liquid soap.  You can apply chg directly to the skin and wash.  Gently with a scrungie or clean washcloth.  5.  Apply the CHG Soap to your body ONLY FROM THE NECK DOWN.   Do   not use on face/ open                           Wound or open sores. Avoid contact with eyes, ears mouth and   genitals (private parts).                       Wash face,  Genitals (private parts) with your normal soap.             6.  Wash thoroughly, paying special attention to the area where your    surgery  will be performed.  7.  Thoroughly rinse your body with warm water  from the neck down.  8.  DO NOT shower/wash with your normal soap after using and rinsing off the CHG Soap.                9.  Pat  yourself dry with a clean towel.            10.  Wear clean pajamas.            11.  Place clean sheets on your bed the night of your first shower and do not  sleep with pets. Day of Surgery : Do not apply any lotions/deodorants the morning of surgery.  Please wear clean clothes to the hospital/surgery center.  FAILURE TO FOLLOW THESE INSTRUCTIONS MAY RESULT IN THE CANCELLATION OF YOUR SURGERY  PATIENT SIGNATURE_________________________________  NURSE SIGNATURE__________________________________  ________________________________________________________________________ WHAT IS A BLOOD TRANSFUSION? Blood Transfusion Information  A transfusion is the replacement of blood or some of its parts. Blood is made up of multiple cells which provide different functions. Red blood cells carry oxygen and are used for blood loss replacement. White blood cells fight against infection. Platelets control bleeding. Plasma helps clot blood. Other blood products are available for specialized needs, such as hemophilia or other clotting disorders. BEFORE THE TRANSFUSION  Who gives blood for transfusions?  Healthy volunteers who are fully evaluated to make sure their blood is safe. This is blood bank blood. Transfusion therapy is the safest it has ever been in the practice of medicine. Before blood is taken from a donor, a complete history is taken to make sure that person has no history of diseases nor engages in risky social behavior (examples are intravenous drug use or sexual activity with multiple partners). The donor's travel history is screened to minimize risk of transmitting infections, such as malaria. The donated blood is tested for signs of infectious diseases, such as HIV and hepatitis. The blood is then tested to be sure it is compatible with you in order to minimize the chance of a transfusion reaction. If you or a relative donates blood, this is often done in anticipation of surgery and is not  appropriate for emergency situations. It takes many days to process the donated blood. RISKS AND COMPLICATIONS Although transfusion therapy is very safe and saves many lives, the main dangers of transfusion include:  Getting an infectious disease. Developing a transfusion reaction. This is an allergic reaction to something in the blood you were given. Every precaution is taken to prevent this. The decision to have a blood transfusion has been considered carefully by your caregiver before blood is given. Blood is not given unless the benefits outweigh the risks. AFTER THE TRANSFUSION Right after receiving a  blood transfusion, you will usually feel much better and more energetic. This is especially true if your red blood cells have gotten low (anemic). The transfusion raises the level of the red blood cells which carry oxygen, and this usually causes an energy increase. The nurse administering the transfusion will monitor you carefully for complications. HOME CARE INSTRUCTIONS  No special instructions are needed after a transfusion. You may find your energy is better. Speak with your caregiver about any limitations on activity for underlying diseases you may have. SEEK MEDICAL CARE IF:  Your condition is not improving after your transfusion. You develop redness or irritation at the intravenous (IV) site. SEEK IMMEDIATE MEDICAL CARE IF:  Any of the following symptoms occur over the next 12 hours: Shaking chills. You have a temperature by mouth above 102 F (38.9 C), not controlled by medicine. Chest, back, or muscle pain. People around you feel you are not acting correctly or are confused. Shortness of breath or difficulty breathing. Dizziness and fainting. You get a rash or develop hives. You have a decrease in urine output. Your urine turns a dark color or changes to pink, red, or brown. Any of the following symptoms occur over the next 10 days: You have a temperature by mouth above 102 F  (38.9 C), not controlled by medicine. Shortness of breath. Weakness after normal activity. The white part of the eye turns yellow (jaundice). You have a decrease in the amount of urine or are urinating less often. Your urine turns a dark color or changes to pink, red, or brown. Document Released: 01/12/2000 Document Revised: 04/08/2011 Document Reviewed: 08/31/2007 [facility] Patient Information 2014 [place], [place].  _______________________________________________________________________

## 2023-10-14 NOTE — Telephone Encounter (Signed)
 Per [name] NP, LVM for [name] to call back regarding surgery date.

## 2023-10-14 NOTE — Progress Notes (Signed)
 For Anesthesia: PCP - Glendia DELENA Fielding, MD  Cardiologist - N/A  Bowel Prep reminder: N/A  Chest x-ray - N/A EKG - N/A Stress Test - N/A ECHO - N/A Cardiac Cath - N/A Pacemaker/ICD device last checked: N/A Pacemaker orders received: N/A Device Rep notified: N/A  Spinal Cord Stimulator: N/A  Sleep Study - N/A CPAP - N/A  Fasting Blood Sugar - N/A Checks Blood Sugar ___N/A__ times a day Date and result of last Hgb A1c-N/A  Last dose of GLP1 agonist- N/A GLP1 instructions: Hold 7 days prior to schedule (Hold 24 hours-daily)   Last dose of SGLT-2 inhibitors- N/A SGLT-2 instructions: Hold 72 hours prior to surgery  Blood Thinner Instructions:N/A Last Dose:N/A Time last taken:N/A  Aspirin Instructions:N/A Last Dose:N/A Time last taken:N/A  Activity level: Able to exercise without chest pain and/or shortness of breath     Anesthesia review: N/A  Patient denies shortness of breath, fever, cough and chest pain at PAT appointment   Patient verbalized understanding of instructions that were reviewed over the telephone.

## 2023-10-14 NOTE — Telephone Encounter (Signed)
 Jennifer Cobb agrees to new surgery date of 9/18. She is aware she will get a phone call from pre-admit testing.   [name] NP notified of pt agreeing to surgery date of 9/18

## 2023-10-15 ENCOUNTER — Telehealth: Payer: Self-pay | Admitting: *Deleted

## 2023-10-15 ENCOUNTER — Encounter (HOSPITAL_COMMUNITY)
Admission: RE | Admit: 2023-10-15 | Discharge: 2023-10-15 | Disposition: A | Discharge status: Home / Self Care | Source: Ambulatory Visit | Attending: Gynecologic Oncology | Admitting: Gynecologic Oncology

## 2023-10-15 DIAGNOSIS — C541 Malignant neoplasm of endometrium: Secondary | ICD-10-CM | POA: Diagnosis not present

## 2023-10-15 DIAGNOSIS — Z01812 Encounter for preprocedural laboratory examination: Secondary | ICD-10-CM | POA: Insufficient documentation

## 2023-10-15 LAB — COMPREHENSIVE METABOLIC PANEL WITH GFR
ALT: 17 U/L (ref 0–44)
AST: 21 U/L (ref 15–41)
Albumin: 4.4 g/dL (ref 3.5–5.0)
Alkaline Phosphatase: 132 U/L — ABNORMAL HIGH (ref 38–126)
Anion gap: 10 (ref 5–15)
BUN: 13 mg/dL (ref 6–20)
CO2: 25 mmol/L (ref 22–32)
Calcium: 9.8 mg/dL (ref 8.9–10.3)
Chloride: 105 mmol/L (ref 98–111)
Creatinine, Ser: 0.86 mg/dL (ref 0.44–1.00)
GFR, Estimated: >60 mL/min (ref >60)
Glucose, Bld: 95 mg/dL (ref 70–99)
Potassium: 4.8 mmol/L (ref 3.5–5.1)
Sodium: 140 mmol/L (ref 135–145)
Total Bilirubin: 0.2 mg/dL (ref 0.0–1.2)
Total Protein: 7.4 g/dL (ref 6.5–8.1)

## 2023-10-15 LAB — CBC
HCT: 42.9 % (ref 36.0–46.0)
Hemoglobin: 13.2 g/dL (ref 12.0–15.0)
MCH: 26.3 pg (ref 26.0–34.0)
MCHC: 30.8 g/dL (ref 30.0–36.0)
MCV: 85.6 fL (ref 80.0–100.0)
Platelets: 401 K/uL — ABNORMAL HIGH (ref 150–400)
RBC: 5.01 MIL/uL (ref 3.87–5.11)
RDW: 13.2 % (ref 11.5–15.5)
WBC: 6.9 K/uL (ref 4.0–10.5)
nRBC: 0 % (ref 0.0–0.2)

## 2023-10-15 NOTE — Discharge Instructions (Signed)
 AFTER SURGERY INSTRUCTIONS   Return to work: 4-6 weeks if applicable   Activity: 1. Be up and out of the bed during the day.  Take a nap if needed.  You may walk up steps but be careful and use the hand rail.  Stair climbing will tire you more than you think, you may need to stop part way and rest.    2. No lifting or straining for 6 weeks over 10 pounds. No pushing, pulling, straining for 6 weeks.   3. No driving for 1-61 days when the following criteria have been met: Do not drive if you are taking narcotic pain medicine and make sure that your reaction time has returned.    4. You can shower as soon as the next day after surgery. Shower daily.  Use your regular soap and water  (not directly on the incision) and pat your incision(s) dry afterwards; don't rub.  No tub baths or submerging your body in water  until cleared by your surgeon. If you have the soap that was given to you by pre-surgical testing that was used before surgery, you do not need to use it afterwards because this can irritate your incisions.    5. No sexual activity and nothing in the vagina for 12 weeks.   6. You may experience a small amount of clear drainage from your incisions, which is normal.  If the drainage persists, increases, or changes color please call the office.   7. Do not use creams, lotions, or ointments such as neosporin on your incisions after surgery until advised by your surgeon because they can cause removal of the dermabond glue on your incisions.     8. You may experience vaginal spotting after surgery or when the stitches at the top of the vagina begin to dissolve.  The spotting is normal but if you experience heavy bleeding, call our office.   9. Take Tylenol or ibuprofen first for pain if you are able to take these medications and only use narcotic pain medication for severe pain not relieved by the Tylenol or Ibuprofen.  Monitor your Tylenol intake to a max of 4,000 mg in a 24 hour period. You can  alternate these medications after surgery.   Diet: 1. Low sodium Heart Healthy Diet is recommended but you are cleared to resume your normal (before surgery) diet after your procedure.   2. It is safe to use a laxative, such as Miralax or Colace, if you have difficulty moving your bowels before surgery. You have been prescribed Sennakot-S to take at bedtime every evening after surgery to keep bowel movements regular and to prevent constipation.     Wound Care: 1. Keep clean and dry.  Shower daily.   Reasons to call the Doctor: Fever - Oral temperature greater than 100.4 degrees Fahrenheit Foul-smelling vaginal discharge Difficulty urinating Nausea and vomiting Increased pain at the site of the incision that is unrelieved with pain medicine. Difficulty breathing with or without chest pain New calf pain especially if only on one side Sudden, continuing increased vaginal bleeding with or without clots.   Contacts: For questions or concerns you should contact:   Dr. Wiley Hanger at [phone]   Vira Grieves, NP at [phone]   After Hours: call [phone] and have the GYN Oncologist paged/contacted (after 5 pm or on the weekends). You will speak with an after hours RN and let he or she know you have had surgery.   Messages sent via mychart are for non-urgent  matters and are not responded to after hours so for urgent needs, please call the after hours number.

## 2023-10-15 NOTE — Telephone Encounter (Signed)
Attempted to reach patient for pre-op call. Left voicemail requesting call back to [phone].

## 2023-10-15 NOTE — Telephone Encounter (Signed)
Telephone call to check on pre-operative status.  Patient compliant with pre-operative instructions.  Reinforced nothing to eat after midnight. Clear liquids until 11:30. Patient to arrive at 12:30. No questions or concerns voiced.  Instructed to call for any needs. 

## 2023-10-15 NOTE — Telephone Encounter (Signed)
 2nd attempt to reach patient for pre-op call. Unable to leave a message due to mailbox being full.

## 2023-10-16 ENCOUNTER — Ambulatory Visit (HOSPITAL_COMMUNITY): Admitting: Anesthesiology

## 2023-10-16 ENCOUNTER — Ambulatory Visit (HOSPITAL_COMMUNITY)
Admission: RE | Admit: 2023-10-16 | Discharge: 2023-10-16 | Disposition: A | Discharge status: Home / Self Care | Attending: Gynecologic Oncology | Admitting: Gynecologic Oncology

## 2023-10-16 ENCOUNTER — Encounter (HOSPITAL_COMMUNITY)
Admission: RE | Disposition: A | Discharge status: Home / Self Care | Payer: Self-pay | Source: Home / Self Care | Attending: Gynecologic Oncology

## 2023-10-16 ENCOUNTER — Encounter (HOSPITAL_COMMUNITY): Payer: Self-pay | Admitting: Gynecologic Oncology

## 2023-10-16 DIAGNOSIS — N83202 Unspecified ovarian cyst, left side: Secondary | ICD-10-CM | POA: Insufficient documentation

## 2023-10-16 DIAGNOSIS — Z780 Asymptomatic menopausal state: Secondary | ICD-10-CM | POA: Diagnosis not present

## 2023-10-16 DIAGNOSIS — Z170 Estrogen receptor positive status [ER+]: Secondary | ICD-10-CM | POA: Insufficient documentation

## 2023-10-16 DIAGNOSIS — C541 Malignant neoplasm of endometrium: Secondary | ICD-10-CM

## 2023-10-16 DIAGNOSIS — N83201 Unspecified ovarian cyst, right side: Secondary | ICD-10-CM | POA: Diagnosis not present

## 2023-10-16 DIAGNOSIS — N888 Other specified noninflammatory disorders of cervix uteri: Secondary | ICD-10-CM | POA: Diagnosis not present

## 2023-10-16 DIAGNOSIS — N838 Other noninflammatory disorders of ovary, fallopian tube and broad ligament: Secondary | ICD-10-CM | POA: Diagnosis not present

## 2023-10-16 HISTORY — PX: LYMPH NODE BIOPSY: SHX201

## 2023-10-16 HISTORY — PX: ROBOTIC ASSISTED TOTAL HYSTERECTOMY WITH BILATERAL SALPINGO OOPHERECTOMY: SHX6086

## 2023-10-16 HISTORY — PX: INJECTION, FOR SENTINEL LYMPH NODE IDENTIFICATION: SHX7598

## 2023-10-16 HISTORY — DX: Malignant neoplasm of endometrium: C54.1

## 2023-10-16 LAB — TYPE AND SCREEN
ABO/RH(D): B POS
Antibody Screen: NEGATIVE

## 2023-10-16 LAB — ABO/RH: ABO/RH(D): B POS

## 2023-10-16 SURGERY — HYSTERECTOMY, TOTAL, ROBOT-ASSISTED, LAPAROSCOPIC, WITH BILATERAL SALPINGO-OOPHORECTOMY
Anesthesia: General

## 2023-10-16 MED ORDER — METRONIDAZOLE 500 MG/100ML IV SOLN
500.0000 mg | INTRAVENOUS | Status: AC
  Administered 2023-10-16: 500 mg via INTRAVENOUS
  Filled 2023-10-16: qty 100

## 2023-10-16 MED ORDER — PROPOFOL 10 MG/ML IV BOLUS
INTRAVENOUS | Status: DC | PRN
  Administered 2023-10-16: 130 mg via INTRAVENOUS

## 2023-10-16 MED ORDER — HEPARIN SODIUM (PORCINE) 5000 UNIT/ML IJ SOLN
5000.0000 [IU] | INTRAMUSCULAR | Status: AC
  Administered 2023-10-16: 5000 [IU] via SUBCUTANEOUS
  Filled 2023-10-16: qty 1

## 2023-10-16 MED ORDER — STERILE WATER FOR IRRIGATION IR SOLN
Status: DC | PRN
  Administered 2023-10-16: 1000 mL

## 2023-10-16 MED ORDER — LACTATED RINGERS IV SOLN: INTRAVENOUS | Status: DC

## 2023-10-16 MED ORDER — FENTANYL CITRATE (PF) 100 MCG/2ML IJ SOLN
INTRAMUSCULAR | Status: DC | PRN
  Administered 2023-10-16: 100 ug via INTRAVENOUS

## 2023-10-16 MED ORDER — ROCURONIUM BROMIDE 10 MG/ML (PF) SYRINGE
PREFILLED_SYRINGE | INTRAVENOUS | Status: AC
  Filled 2023-10-16: qty 10

## 2023-10-16 MED ORDER — OXYCODONE HCL 5 MG PO TABS
5.0000 mg | ORAL_TABLET | Freq: Once | ORAL | Status: AC | PRN
  Administered 2023-10-16: 5 mg via ORAL

## 2023-10-16 MED ORDER — OXYCODONE HCL 5 MG/5ML PO SOLN: 5.0000 mg | Freq: Once | ORAL | Status: AC | PRN

## 2023-10-16 MED ORDER — MIDAZOLAM HCL 5 MG/5ML IJ SOLN
INTRAMUSCULAR | Status: DC | PRN
  Administered 2023-10-16: 2 mg via INTRAVENOUS

## 2023-10-16 MED ORDER — SUGAMMADEX SODIUM 200 MG/2ML IV SOLN
INTRAVENOUS | Status: AC
  Filled 2023-10-16: qty 2

## 2023-10-16 MED ORDER — FENTANYL CITRATE (PF) 100 MCG/2ML IJ SOLN
INTRAMUSCULAR | Status: AC
  Filled 2023-10-16: qty 2

## 2023-10-16 MED ORDER — FENTANYL CITRATE PF 50 MCG/ML IJ SOSY: 25.0000 ug | PREFILLED_SYRINGE | INTRAMUSCULAR | Status: DC | PRN

## 2023-10-16 MED ORDER — ROCURONIUM BROMIDE 100 MG/10ML IV SOLN
INTRAVENOUS | Status: DC | PRN
  Administered 2023-10-16: 100 mg via INTRAVENOUS

## 2023-10-16 MED ORDER — ACETAMINOPHEN 500 MG PO TABS
1000.0000 mg | ORAL_TABLET | ORAL | Status: AC
  Administered 2023-10-16: 1000 mg via ORAL
  Filled 2023-10-16: qty 2

## 2023-10-16 MED ORDER — STERILE WATER FOR INJECTION IJ SOLN
INTRAMUSCULAR | Status: DC | PRN
  Administered 2023-10-16: 4 mL

## 2023-10-16 MED ORDER — BUPIVACAINE HCL (PF) 0.25 % IJ SOLN
INTRAMUSCULAR | Status: AC
Start: 2023-10-16 — End: 2023-10-16
  Filled 2023-10-16: qty 30

## 2023-10-16 MED ORDER — ORAL CARE MOUTH RINSE: 15.0000 mL | Freq: Once | OROMUCOSAL | Status: DC

## 2023-10-16 MED ORDER — DEXAMETHASONE SODIUM PHOSPHATE 10 MG/ML IJ SOLN
INTRAMUSCULAR | Status: AC
  Filled 2023-10-16: qty 1

## 2023-10-16 MED ORDER — LACTATED RINGERS IV SOLN: INTRAVENOUS | Status: DC | PRN

## 2023-10-16 MED ORDER — BUPIVACAINE LIPOSOME 1.3 % IJ SUSP
INTRAMUSCULAR | Status: AC
  Filled 2023-10-16: qty 20

## 2023-10-16 MED ORDER — DROPERIDOL 2.5 MG/ML IJ SOLN: 0.6250 mg | Freq: Once | INTRAMUSCULAR | Status: DC | PRN

## 2023-10-16 MED ORDER — ONDANSETRON HCL 4 MG/2ML IJ SOLN
INTRAMUSCULAR | Status: DC | PRN
  Administered 2023-10-16: 4 mg via INTRAVENOUS

## 2023-10-16 MED ORDER — SODIUM CHLORIDE (PF) 0.9 % IJ SOLN
INTRAMUSCULAR | Status: AC
  Filled 2023-10-16: qty 20

## 2023-10-16 MED ORDER — HYDROMORPHONE HCL 2 MG/ML IJ SOLN
INTRAMUSCULAR | Status: AC
  Filled 2023-10-16: qty 1

## 2023-10-16 MED ORDER — LACTATED RINGERS IR SOLN
Status: DC | PRN
  Administered 2023-10-16: 1000 mL

## 2023-10-16 MED ORDER — SUGAMMADEX SODIUM 200 MG/2ML IV SOLN
INTRAVENOUS | Status: DC | PRN
  Administered 2023-10-16: 200 mg via INTRAVENOUS

## 2023-10-16 MED ORDER — PHENYLEPHRINE HCL-NACL 20-0.9 MG/250ML-% IV SOLN
INTRAVENOUS | Status: DC | PRN
  Administered 2023-10-16: 20 ug/min via INTRAVENOUS

## 2023-10-16 MED ORDER — DEXAMETHASONE SODIUM PHOSPHATE 10 MG/ML IJ SOLN: 4.0000 mg | INTRAMUSCULAR | Status: DC

## 2023-10-16 MED ORDER — DEXAMETHASONE SODIUM PHOSPHATE 10 MG/ML IJ SOLN
INTRAMUSCULAR | Status: DC | PRN
  Administered 2023-10-16: 8 mg via INTRAVENOUS

## 2023-10-16 MED ORDER — CEFAZOLIN SODIUM-DEXTROSE 2-4 GM/100ML-% IV SOLN
2.0000 g | INTRAVENOUS | Status: AC
  Administered 2023-10-16: 2 g via INTRAVENOUS
  Filled 2023-10-16: qty 100

## 2023-10-16 MED ORDER — OXYCODONE HCL 5 MG PO TABS: 5.0000 mg | ORAL_TABLET | ORAL | Status: DC | PRN

## 2023-10-16 MED ORDER — BUPIVACAINE HCL 0.25 % IJ SOLN
INTRAMUSCULAR | Status: DC | PRN
  Administered 2023-10-16: 30 mL

## 2023-10-16 MED ORDER — LIDOCAINE HCL (PF) 2 % IJ SOLN
INTRAMUSCULAR | Status: AC
  Filled 2023-10-16: qty 5

## 2023-10-16 MED ORDER — LIDOCAINE HCL (CARDIAC) PF 100 MG/5ML IV SOSY
PREFILLED_SYRINGE | INTRAVENOUS | Status: DC | PRN
  Administered 2023-10-16: 60 mg via INTRAVENOUS

## 2023-10-16 MED ORDER — STERILE WATER FOR INJECTION IJ SOLN
INTRAMUSCULAR | Status: AC
  Filled 2023-10-16: qty 10

## 2023-10-16 MED ORDER — HEMOSTATIC AGENTS (NO CHARGE) OPTIME
TOPICAL | Status: DC | PRN
  Administered 2023-10-16: 1 via TOPICAL

## 2023-10-16 MED ORDER — TRAMADOL HCL 50 MG PO TABS: 50.0000 mg | ORAL_TABLET | Freq: Four times a day (QID) | ORAL | Status: DC | PRN

## 2023-10-16 MED ORDER — PROPOFOL 10 MG/ML IV BOLUS
INTRAVENOUS | Status: AC
  Filled 2023-10-16: qty 20

## 2023-10-16 MED ORDER — MIDAZOLAM HCL 2 MG/2ML IJ SOLN
INTRAMUSCULAR | Status: AC
  Filled 2023-10-16: qty 2

## 2023-10-16 MED ORDER — ONDANSETRON HCL 4 MG PO TABS: 4.0000 mg | ORAL_TABLET | Freq: Four times a day (QID) | ORAL | Status: DC | PRN

## 2023-10-16 MED ORDER — ONDANSETRON HCL 4 MG/2ML IJ SOLN
INTRAMUSCULAR | Status: AC
  Filled 2023-10-16: qty 2

## 2023-10-16 MED ORDER — HYDROMORPHONE HCL 1 MG/ML IJ SOLN
INTRAMUSCULAR | Status: DC | PRN
  Administered 2023-10-16: .5 mg via INTRAVENOUS
  Administered 2023-10-16: 1 mg via INTRAVENOUS

## 2023-10-16 MED ORDER — EPHEDRINE SULFATE-NACL 50-0.9 MG/10ML-% IV SOSY
PREFILLED_SYRINGE | INTRAVENOUS | Status: DC | PRN
  Administered 2023-10-16: 10 mg via INTRAVENOUS

## 2023-10-16 MED ORDER — OXYCODONE HCL 5 MG PO TABS
ORAL_TABLET | ORAL | Status: AC
  Filled 2023-10-16: qty 1

## 2023-10-16 MED ORDER — CHLORHEXIDINE GLUCONATE 0.12 % MT SOLN: 15.0000 mL | Freq: Once | OROMUCOSAL | Status: DC

## 2023-10-16 MED ORDER — ONDANSETRON HCL 4 MG/2ML IJ SOLN: 4.0000 mg | Freq: Four times a day (QID) | INTRAMUSCULAR | Status: DC | PRN

## 2023-10-16 MED ORDER — SCOPOLAMINE 1 MG/3DAYS TD PT72
1.0000 | MEDICATED_PATCH | TRANSDERMAL | Status: DC
  Administered 2023-10-16: 1 mg via TRANSDERMAL
  Filled 2023-10-16: qty 1

## 2023-10-16 SURGICAL SUPPLY — 70 items
APPLICATOR SURGIFLO ENDO (HEMOSTASIS) IMPLANT
BAG COUNTER SPONGE SURGICOUNT (BAG) IMPLANT
BAG LAPAROSCOPIC 12 15 PORT 16 (BASKET) IMPLANT
BLADE SURG SZ10 CARB STEEL (BLADE) IMPLANT
COVER BACK TABLE 60X90IN (DRAPES) ×2 IMPLANT
COVER TIP SHEARS 8 DVNC (MISCELLANEOUS) ×2 IMPLANT
DERMABOND ADVANCED .7 DNX12 (GAUZE/BANDAGES/DRESSINGS) ×2 IMPLANT
DRAPE ARM DVNC X/XI (DISPOSABLE) ×8 IMPLANT
DRAPE COLUMN DVNC XI (DISPOSABLE) ×2 IMPLANT
DRAPE SHEET LG 3/4 BI-LAMINATE (DRAPES) ×2 IMPLANT
DRAPE SURG IRRIG POUCH 19X23 (DRAPES) ×2 IMPLANT
DRIVER NDL MEGA SUTCUT DVNCXI (INSTRUMENTS) ×2 IMPLANT
DRIVER NDLE MEGA SUTCUT DVNCXI (INSTRUMENTS) ×2 IMPLANT
DRSG OPSITE POSTOP 4X6 (GAUZE/BANDAGES/DRESSINGS) IMPLANT
DRSG OPSITE POSTOP 4X8 (GAUZE/BANDAGES/DRESSINGS) IMPLANT
ELECT PENCIL ROCKER SW 15FT (MISCELLANEOUS) IMPLANT
ELECT REM PT RETURN 15FT ADLT (MISCELLANEOUS) ×2 IMPLANT
FORCEPS BPLR FENES DVNC XI (FORCEP) ×2 IMPLANT
FORCEPS PROGRASP DVNC XI (FORCEP) ×2 IMPLANT
GAUZE 4X4 16PLY ~~LOC~~+RFID DBL (SPONGE) ×4 IMPLANT
GLOVE BIO SURGEON STRL SZ 6 (GLOVE) ×8 IMPLANT
GLOVE BIO SURGEON STRL SZ 6.5 (GLOVE) ×2 IMPLANT
GOWN STRL REUS W/ TWL LRG LVL3 (GOWN DISPOSABLE) ×8 IMPLANT
GRASPER SUT TROCAR 14GX15 (MISCELLANEOUS) IMPLANT
HOLDER FOLEY CATH W/STRAP (MISCELLANEOUS) IMPLANT
IRRIGATION SUCT STRKRFLW 2 WTP (MISCELLANEOUS) ×2 IMPLANT
KIT PROCEDURE DVNC SI (MISCELLANEOUS) IMPLANT
KIT TURNOVER KIT A (KITS) ×2 IMPLANT
LIGASURE IMPACT 36 18CM CVD LR (INSTRUMENTS) IMPLANT
MANIPULATOR ADVINCU DEL 3.0 PL (MISCELLANEOUS) IMPLANT
MANIPULATOR ADVINCU DEL 3.5 PL (MISCELLANEOUS) IMPLANT
MANIPULATOR UTERINE 4.5 ZUMI (MISCELLANEOUS) IMPLANT
NDL HYPO 21X1.5 SAFETY (NEEDLE) ×2 IMPLANT
NDL SPNL 18GX3.5 QUINCKE PK (NEEDLE) IMPLANT
NEEDLE HYPO 21X1.5 SAFETY (NEEDLE) ×2 IMPLANT
NEEDLE SPNL 18GX3.5 QUINCKE PK (NEEDLE) IMPLANT
OBTURATOR OPTICALSTD 8 DVNC (TROCAR) ×2 IMPLANT
PACK ROBOT GYN CUSTOM WL (TRAY / TRAY PROCEDURE) ×2 IMPLANT
PAD POSITIONING PINK XL (MISCELLANEOUS) ×2 IMPLANT
PORT ACCESS TROCAR AIRSEAL 12 (TROCAR) IMPLANT
SCISSORS LAP 5X45 EPIX DISP (ENDOMECHANICALS) IMPLANT
SCISSORS MNPLR CVD DVNC XI (INSTRUMENTS) ×2 IMPLANT
SCRUB CHG 4% DYNA-HEX 4OZ (MISCELLANEOUS) IMPLANT
SEAL UNIV 5-12 XI (MISCELLANEOUS) ×8 IMPLANT
SET TRI-LUMEN FLTR TB AIRSEAL (TUBING) ×2 IMPLANT
SPIKE FLUID TRANSFER (MISCELLANEOUS) ×2 IMPLANT
SPONGE T-LAP 18X18 ~~LOC~~+RFID (SPONGE) IMPLANT
SURGIFLO W/THROMBIN 8M KIT (HEMOSTASIS) IMPLANT
SUT MNCRL AB 4-0 PS2 18 (SUTURE) IMPLANT
SUT PDS AB 1 TP1 96 (SUTURE) IMPLANT
SUT STRATA PDS 0 30 CT-2.5 (SUTURE) IMPLANT
SUT V-LOC 180 0-0 GS22 (SUTURE) IMPLANT
SUT VIC AB 0 CT1 27XBRD ANTBC (SUTURE) IMPLANT
SUT VIC AB 2-0 CT1 TAPERPNT 27 (SUTURE) IMPLANT
SUT VIC AB 2-0 SH 27X BRD (SUTURE) IMPLANT
SUT VIC AB 4-0 PS2 18 (SUTURE) ×4 IMPLANT
SUT VICRYL 0 27 CT2 27 ABS (SUTURE) ×2 IMPLANT
SUT VLOC 180 0 9IN GS21 (SUTURE) IMPLANT
SUTURE STRATFX 0 PDS+ CT-2 23 (SUTURE) IMPLANT
SYR 10ML LL (SYRINGE) IMPLANT
SYSTEM BAG RETRIEVAL 10MM (BASKET) IMPLANT
SYSTEM RETRIEVL 5MM INZII UNIV (BASKET) IMPLANT
SYSTEM WOUND ALEXIS 18CM MED (MISCELLANEOUS) IMPLANT
TRAP SPECIMEN MUCUS 40CC (MISCELLANEOUS) IMPLANT
TRAY FOLEY MTR SLVR 16FR STAT (SET/KITS/TRAYS/PACK) ×2 IMPLANT
TROCAR PORT AIRSEAL 5X120 (TROCAR) IMPLANT
TROCAR XCEL NON-BLD 5MMX100MML (ENDOMECHANICALS) ×2 IMPLANT
UNDERPAD 30X36 HEAVY ABSORB (UNDERPADS AND DIAPERS) ×4 IMPLANT
WATER STERILE IRR 1000ML POUR (IV SOLUTION) ×2 IMPLANT
YANKAUER SUCT BULB TIP 10FT TU (MISCELLANEOUS) IMPLANT

## 2023-10-16 NOTE — Transfer of Care (Signed)
 Immediate Anesthesia Transfer of Care Note  Patient: Jennifer Cobb  Procedure(s) Performed: HYSTERECTOMY, TOTAL, ROBOT-ASSISTED, LAPAROSCOPIC, WITH BILATERAL SALPINGO-OOPHORECTOMY (Bilateral) INJECTION, FOR SENTINEL LYMPH NODE IDENTIFICATION LYMPH NODE BIOPSY  Patient Location: PACU  Anesthesia Type:General  Level of Consciousness: drowsy  Airway & Oxygen Therapy: Patient Spontanous Breathing and Patient connected to face mask oxygen  Post-op Assessment: Report given to RN and Post -op Vital signs reviewed and stable  Post vital signs: Reviewed and stable  Last Vitals:  Vitals Value Taken Time  BP 106/71 10/16/23 16:55  Temp    Pulse 62 10/16/23 16:58  Resp 17 10/16/23 16:58  SpO2 100 % 10/16/23 16:58  Vitals shown include unfiled device data.  Last Pain:  Vitals:   10/16/23 1237  TempSrc: Oral  PainSc:          Complications: No notable events documented.

## 2023-10-16 NOTE — Anesthesia Postprocedure Evaluation (Signed)
 Anesthesia Post Note  Patient: Jennifer Cobb  Procedure(s) Performed: HYSTERECTOMY, TOTAL, ROBOT-ASSISTED, LAPAROSCOPIC, WITH BILATERAL SALPINGO-OOPHORECTOMY (Bilateral) INJECTION, FOR SENTINEL LYMPH NODE IDENTIFICATION LYMPH NODE BIOPSY     Patient location during evaluation: PACU Anesthesia Type: General Level of consciousness: awake and alert Pain management: pain level controlled Vital Signs Assessment: post-procedure vital signs reviewed and stable Respiratory status: spontaneous breathing, nonlabored ventilation, respiratory function stable and patient connected to nasal cannula oxygen Cardiovascular status: blood pressure returned to baseline and stable Postop Assessment: no apparent nausea or vomiting Anesthetic complications: no   No notable events documented.  Last Vitals:  Vitals:   10/16/23 1733 10/16/23 1737  BP:    Pulse: 65 80  Resp: 18 (!) 9  Temp:    SpO2: 100% 100%    Last Pain:  Vitals:   10/16/23 1730  TempSrc:   PainSc: Asleep                 Rome Ade

## 2023-10-16 NOTE — Anesthesia Procedure Notes (Signed)
 Procedure Name: Intubation Date/Time: 10/16/2023 2:37 PM  Performed by: Deeann Eva BROCKS, CRNAPre-anesthesia Checklist: Patient identified, Emergency Drugs available, Suction available and Patient being monitored Patient Re-evaluated:Patient Re-evaluated prior to induction Oxygen Delivery Method: Circle System Utilized Preoxygenation: Pre-oxygenation with 100% oxygen Induction Type: IV induction Ventilation: Mask ventilation without difficulty Laryngoscope Size: Mac and 3 Grade View: Grade III Tube type: Oral Tube size: 7.0 mm Number of attempts: 1 Airway Equipment and Method: Stylet Placement Confirmation: ETT inserted through vocal cords under direct vision, positive ETCO2 and breath sounds checked- equal and bilateral Secured at: 20 cm Tube secured with: Tape Dental Injury: Teeth and Oropharynx as per pre-operative assessment

## 2023-10-16 NOTE — Interval H&P Note (Signed)
 History and Physical Interval Note:  10/16/2023 12:51 PM  Jennifer Cobb  has presented today for surgery, with the diagnosis of C54.1 ENDOMETRIAL CANCER.  The various methods of treatment have been discussed with the patient and family. After consideration of risks, benefits and other options for treatment, the patient has consented to  Procedure(s): HYSTERECTOMY, TOTAL, ROBOT-ASSISTED, LAPAROSCOPIC, WITH BILATERAL SALPINGO-OOPHORECTOMY (Bilateral) INJECTION, FOR SENTINEL LYMPH NODE IDENTIFICATION (N/A) LYMPH NODE BIOPSY (N/A) LYMPHADENECTOMY, PELVIS, ROBOT-ASSISTED (N/A) as a surgical intervention.  The patient's history has been reviewed, patient examined, no change in status, stable for surgery.  I have reviewed the patient's chart and labs.  Questions were answered to the patient's satisfaction.     Comer JONELLE Dollar

## 2023-10-16 NOTE — Op Note (Signed)
 OPERATIVE NOTE  Pre-operative Diagnosis: endometrial cancer grade 1  Post-operative Diagnosis: same  Operation: Robotic-assisted laparoscopic total hysterectomy with bilateral salpingoophorectomy, SLN biopsy bilaterally  Surgeon: Viktoria Crank MD  Assistant Surgeon: [name], NP (an NP assistant was necessary for tissue manipulation, management of robotic instrumentation, retraction and positioning due to the complexity of the case and hospital policies).   Anesthesia: GET  Urine Output: 400 cc  Operative Findings: On EUA, small mobile uterus. On intra-abdominal entry, normal upper abdominal survey. Normal omentum, small and large bowel. Uterus 8 cm and normal in appearance. Normal bilateral adnexa. Mapping successful to SLN just medial of the internal iliac vein, and to a right obturator and right external iliac SLNs. No adenopathy.   Estimated Blood Loss:  100 cc      Total IV Fluids: see I&O flowsheet         Specimens: uterus, cervix, bilateral tubes and ovaries, left external iliac and obturator SLNs, right internal iliac vein SLN         Complications:  None apparent; patient tolerated the procedure well.         Disposition: PACU - hemodynamically stable.  Procedure Details  The patient was seen in the Holding Room. The risks, benefits, complications, treatment options, and expected outcomes were discussed with the patient.  The patient concurred with the proposed plan, giving informed consent.  The site of surgery properly noted/marked. The patient was identified as Jennifer Cobb and the procedure verified as a Robotic-assisted hysterectomy with bilateral salpingo oophorectomy with SLN biopsy.   After induction of anesthesia, the patient was draped and prepped in the usual sterile manner. Patient was placed in supine position after anesthesia and draped and prepped in the usual sterile manner as follows: Her arms were tucked to her side with all appropriate precautions.   The patient was secured to the bed using padding and tape across her chest.  The patient was placed in the semi-lithotomy position in [place] stirrups.  The perineum and vagina were prepped with CHG. The patient's abdomen was prepped with ChloraPrep and she was draped after the prep had been allowed to dry for 3 minutes.  A Time Out was held and the above information confirmed.  The urethra was prepped with Betadine. Foley catheter was placed.  A sterile speculum was placed in the vagina.  The cervix was grasped with a single-tooth tenaculum. 2mg  total of ICG was injected into the cervical stroma at 2 and 9 o'clock with 1cc injected at a 1cm and 2mm depth (concentration 0.5mg /ml) in all locations. The cervix was dilated with Fredirick dilators.  The ZUMI uterine manipulator with a medium colpotomizer ring was placed without difficulty.  A pneum occluder balloon was placed over the manipulator.  OG tube placement was confirmed and to suction.   Next, a 10 mm skin incision was made 1 cm below the subcostal margin in the midclavicular line.  The 5 mm Optiview port and scope was used for direct entry.  Opening pressure was under 10 mm CO2.  The abdomen was insufflated and the findings were noted as above.   At this point and all points during the procedure, the patient's intra-abdominal pressure did not exceed 15 mmHg. Next, an 8 mm skin incision was made superior to the umbilicus and a right and left port were placed about 8 cm lateral to the robot port on the right and left side.  A fourth arm was placed on the right.  The 5 mm  assist trocar was exchanged for a 12 mm airseal port. All ports were placed under direct visualization.  The patient was placed in steep Trendelenburg.  Bowel was folded away into the upper abdomen.  The robot was docked in the normal manner.  The right and left peritoneum were opened parallel to the IP ligament to open the retroperitoneal spaces bilaterally. The round ligaments were  transected. The SLN mapping was performed in bilateral pelvic basins. After identifying the ureters, the para rectal and paravesical spaces were opened up entirely with careful dissection below the level of the ureters bilaterally and to the depth of the uterine artery origin in order to skeletonize the uterine web and ensure visualization of all parametrial channels. The para-aortic basins were carefully exposed and evaluated for isolated para-aortic SLN's. Lymphatic channels were identified travelling to the following visualized sentinel lymph node's: left external iliac, left obturator, and right internal iliac (vein) SLNs. These SLNs were separated from their surrounding lymphatic tissue, removed and sent for permanent pathology.   The hysterectomy was started.  The ureter was again noted to be on the medial leaf of the broad ligament.  The peritoneum above the ureter was incised and stretched and the infundibulopelvic ligament was skeletonized, cauterized and cut.  The posterior peritoneum was taken down to the level of the KOH ring.  The anterior peritoneum was also taken down.  The bladder flap was created to the level of the KOH ring.  The uterine artery on the right side was skeletonized, cauterized and cut in the normal manner.  A similar procedure was performed on the left.  The colpotomy was made and the uterus, cervix, bilateral ovaries and tubes were amputated and delivered through the vagina.  Pedicles were inspected and excellent hemostasis was achieved.    The colpotomy at the vaginal cuff was closed with 0 Vicryl with a figure of eight on each apex and 0 Stratafix to close the midportion of the cuff in two layers in a running manner.  Irrigation was used and excellent hemostasis was achieved.  Intra-abdominal pressure was decreased to 5 mm Hg with excellent hemostasis maintained. Floseal was placed within the bed of the right SLN dissection. At this point in the procedure was completed.   Robotic instruments were removed under direct visulaization.  The robot was undocked. The fascia at the 12 mm port was closed with 0 Vicryl using a PMI fascial closure device under direct visualization.  The subcuticular tissue was closed with 4-0 Vicryl and the skin was closed with 4-0 Monocryl in a subcuticular manner.  Dermabond was applied.    The vagina was swabbed with minimal bleeding noted. Foley catheter was removed  All sponge, lap and needle counts were correct x  3.   The patient was transferred to the recovery room in stable condition.  Comer Dollar, MD

## 2023-10-16 NOTE — Anesthesia Preprocedure Evaluation (Signed)
 Anesthesia Evaluation  Patient identified by MRN, date of birth, ID band Patient awake    Reviewed: Allergy & Precautions, NPO status , Patient's Chart, lab work & pertinent test results  History of Anesthesia Complications Negative for: history of anesthetic complications  Airway Mallampati: II  TM Distance: >3 FB Neck ROM: Full    Dental no notable dental hx. (+) Teeth Intact   Pulmonary neg pulmonary ROS, neg sleep apnea, neg COPD, Patient abstained from smoking.Not current smoker   Pulmonary exam normal breath sounds clear to auscultation       Cardiovascular Exercise Tolerance: Good METS(-) hypertension(-) CAD and (-) Past MI negative cardio ROS (-) dysrhythmias  Rhythm:Regular Rate:Normal - Systolic murmurs    Neuro/Psych negative neurological ROS  negative psych ROS   GI/Hepatic ,neg GERD  ,,(+)     (-) substance abuse    Endo/Other  neg diabetes    Renal/GU negative Renal ROS     Musculoskeletal   Abdominal   Peds  Hematology   Anesthesia Other Findings Past Medical History: No date: Anemia No date: Arthritis No date: Celiac disease No date: Endometrial cancer (HCC)  Reproductive/Obstetrics                              Anesthesia Physical Anesthesia Plan  ASA: 2  Anesthesia Plan: General   Post-op Pain Management: Tylenol  PO (pre-op)* and Toradol IV (intra-op)*   Induction: Intravenous  PONV Risk Score and Plan: 4 or greater and Ondansetron , Dexamethasone  and Midazolam   Airway Management Planned: Oral ETT  Additional Equipment: None  Intra-op Plan:   Post-operative Plan: Extubation in OR  Informed Consent: I have reviewed the patients History and Physical, chart, labs and discussed the procedure including the risks, benefits and alternatives for the proposed anesthesia with the patient or authorized representative who has indicated his/her understanding and  acceptance.     Dental advisory given  Plan Discussed with: CRNA and Surgeon  Anesthesia Plan Comments: (Discussed risks of anesthesia with patient, including PONV, sore throat, lip/dental/eye damage. Rare risks discussed as well, such as cardiorespiratory and neurological sequelae, and allergic reactions. Discussed the role of CRNA in patient's perioperative care. Patient understands. Patient has wedding ring on left fourth finger with swollen PIP joint due to multiple efforts by patient to remove the rings. I attempted to remove the rings as well gently using the dental floss technique without success. I advised patient we will monitor her finger periodically and if there are any concerns about ischemia , we will have to cut the rings. She understands.)        Anesthesia Quick Evaluation

## 2023-10-17 ENCOUNTER — Encounter (HOSPITAL_COMMUNITY): Payer: Self-pay | Admitting: Gynecologic Oncology

## 2023-10-17 ENCOUNTER — Telehealth: Payer: Self-pay | Admitting: *Deleted

## 2023-10-17 NOTE — Telephone Encounter (Signed)
 Spoke with Jennifer Cobb this morning. She states she is eating, drinking and urinating well. She has not had a BM yet but is passing gas. She is taking senokot as prescribed and encouraged her to drink plenty of water . She denies fever or chills. Incisions are dry and intact. She rates her pain 4/10. Her pain is controlled with tylenol .     Instructed to call office with any fever, chills, purulent drainage, uncontrolled pain or any other questions or concerns. Patient verbalizes understanding.   Pt aware of post op appointments as well as the office number [phone] and after hours number [phone] to call if she has any questions or concerns

## 2023-10-21 ENCOUNTER — Ambulatory Visit: Payer: Self-pay | Admitting: Gynecologic Oncology

## 2023-10-22 ENCOUNTER — Other Ambulatory Visit (HOSPITAL_COMMUNITY)

## 2023-10-23 ENCOUNTER — Encounter: Payer: Self-pay | Admitting: Gynecologic Oncology

## 2023-10-23 ENCOUNTER — Inpatient Hospital Stay (HOSPITAL_BASED_OUTPATIENT_CLINIC_OR_DEPARTMENT_OTHER): Admitting: Gynecologic Oncology

## 2023-10-23 DIAGNOSIS — C541 Malignant neoplasm of endometrium: Secondary | ICD-10-CM

## 2023-10-23 DIAGNOSIS — Z90722 Acquired absence of ovaries, bilateral: Secondary | ICD-10-CM

## 2023-10-23 DIAGNOSIS — Z9079 Acquired absence of other genital organ(s): Secondary | ICD-10-CM

## 2023-10-23 DIAGNOSIS — Z90710 Acquired absence of both cervix and uterus: Secondary | ICD-10-CM

## 2023-10-23 DIAGNOSIS — Z7189 Other specified counseling: Secondary | ICD-10-CM

## 2023-10-23 HISTORY — DX: Malignant neoplasm of endometrium: C54.1

## 2023-10-23 NOTE — Progress Notes (Signed)
 Gynecologic Oncology Telehealth Note: Gyn-Onc  I connected with Jennifer Cobb on 10/23/23 at 12:00 PM EDT by telephone and verified that I am speaking with the correct person using two identifiers.  I discussed the limitations, risks, security and privacy concerns of performing an evaluation and management service by telemedicine and the availability of in-person appointments. I also discussed with the patient that there may be a patient responsible charge related to this service. The patient expressed understanding and agreed to proceed.  Other persons participating in the visit and their role in the encounter: none.  Patient's location: home, [place] Provider's location: [facility], [place]  Reason for Visit: follow-up  Treatment History: Patient was seen initially in early August for a routine annual exam.  At that time, she endorsed menopause in 2017 with intermittent postmenopausal bleeding for the last 6 months, sometimes heavy and sometimes light. Pelvic ultrasound exam was performed on 8/19 showing a uterus measuring 9 x 4.5 x 5.2 cm with endometrium thickened at 19 mm and an echogenic mass measuring up to 3.2 cm within the endometrium.  Ovaries were not visualized. The patient underwent endometrial biopsy on 10/07/2023.  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  10/16/23: Robotic-assisted laparoscopic total hysterectomy with bilateral salpingoophorectomy, SLN biopsy bilaterally   Interval History: Doing well. Some discomfort and LUQ incision. Bowels moving well. Denies urinary symptoms. Denies vaginal bleeding.  Past Medical/Surgical History: Past Medical History:  Diagnosis Date   Anemia    Arthritis    Celiac disease    Endometrial cancer [facility])     Past Surgical History:  Procedure Laterality Date   COLONOSCOPY     COLONOSCOPY WITH PROPOFOL  N/A 04/23/2022   Procedure: COLONOSCOPY WITH PROPOFOL ;   Surgeon: Cindie Carlin POUR, DO;  [facility];  Service: Gastroenterology;  Laterality: N/A;  9;00 am, LM to see if pt can move up   EYE SURGERY Left    INJECTION, FOR SENTINEL LYMPH NODE IDENTIFICATION N/A 10/16/2023   Procedure: INJECTION, FOR SENTINEL LYMPH NODE IDENTIFICATION;  Surgeon: Viktoria Comer SAUNDERS, MD;  [facility];  Service: Gynecology;  Laterality: N/A;   LYMPH NODE BIOPSY N/A 10/16/2023   Procedure: LYMPH NODE BIOPSY;  Surgeon: Viktoria Comer SAUNDERS, MD;  [facility];  Service: Gynecology;  Laterality: N/A;   ORIF WRIST FRACTURE Right 08/12/2020   Procedure: Right distal radius open reduction and internal fixation with repair as indicated;  Surgeon: Shari Easter, MD;  Location: [facility] OR;  Service: Orthopedics;  Laterality: Right;    ROBOTIC ASSISTED TOTAL HYSTERECTOMY WITH BILATERAL SALPINGO OOPHERECTOMY Bilateral 10/16/2023   Procedure: HYSTERECTOMY, TOTAL, ROBOT-ASSISTED, LAPAROSCOPIC, WITH BILATERAL SALPINGO-OOPHORECTOMY;  Surgeon: Viktoria Comer SAUNDERS, MD;  [facility];  Service: Gynecology;  Laterality: Bilateral;    Family History  Problem Relation Age of Onset   Colon cancer Mother    Cancer Mother 21       colon   Bladder Cancer Father    Hypertension Father    Syncope episode Daughter    Crohn's disease Daughter    Autism Son    Breast cancer Neg Hx    Ovarian cancer Neg Hx    Endometrial cancer Neg Hx    Pancreatic cancer Neg Hx    Prostate cancer Neg Hx     Social History   Socioeconomic History   Marital status: Married    Spouse name: Not on file  Number of children: Not on file   Years of education: Not on file   Highest education level: Not on file  Occupational History   Not on file  Tobacco Use   Smoking status: Never   Smokeless tobacco: Never  Vaping Use   Vaping status: Never Used  Substance and Sexual Activity   Alcohol use: No   Drug use: No   Sexual activity: Yes    Birth control/protection: Surgical     Comment: vasectomy  Other Topics Concern   Not on file  Social History Narrative   Not on file   Social Drivers of Health   Financial Resource Strain: Low Risk  (09/02/2023)   Overall Financial Resource Strain (CARDIA)    Difficulty of Paying Living Expenses: Not hard at all  Food Insecurity: No Food Insecurity (10/10/2023)   Hunger Vital Sign    Worried About Running Out of Food in the Last Year: Never true    Ran Out of Food in the Last Year: Never true  Transportation Needs: No Transportation Needs (10/10/2023)   PRAPARE - Administrator, Civil Service (Medical): No    Lack of Transportation (Non-Medical): No  Physical Activity: Insufficiently Active (09/02/2023)   Exercise Vital Sign    Days of Exercise per Week: 4 days    Minutes of Exercise per Session: 30 min  Stress: No Stress Concern Present (09/02/2023)   [employer] of Occupational Health - Occupational Stress Questionnaire    Feeling of Stress: Not at all  Social Connections: Socially Integrated (09/02/2023)   Social Connection and Isolation Panel    Frequency of Communication with Friends and Family: More than three times a week    Frequency of Social Gatherings with Friends and Family: Twice a week    Attends Religious Services: More than 4 times per year    Active Member of [employer] or Organizations: Yes    Attends Engineer, structural: More than 4 times per year    Marital Status: Married    Current Medications:  Current Outpatient Medications:    acetaminophen  (TYLENOL ) 500 MG tablet, Take 500-1,000 mg by mouth every 6 (six) hours as needed (pain.)., Disp: , Rfl:    cetirizine (ZYRTEC) 10 MG tablet, Take 10 mg by mouth daily., Disp: , Rfl:    cyanocobalamin (VITAMIN B12) 1000 MCG tablet, Take 1,000 mcg by mouth in the morning., Disp: , Rfl:    ferrous sulfate 325 (65 FE) MG tablet, Take 325 mg by mouth daily with breakfast., Disp: , Rfl:    folic acid (FOLVITE) 400 MCG tablet, Take 400 mcg by  mouth in the morning., Disp: , Rfl:    senna-docusate (SENOKOT-S) 8.6-50 MG tablet, Take 2 tablets by mouth at bedtime. For AFTER surgery, do not take if having diarrhea, Disp: 30 tablet, Rfl: 0   sertraline  (ZOLOFT ) 50 MG tablet, TAKE ONE (1) TABLET BY MOUTH EVERY DAY, Disp: 30 tablet, Rfl: 11   sodium chloride  (OCEAN) 0.65 % SOLN nasal spray, Place 1 spray into both nostrils daily as needed for congestion., Disp: , Rfl:    Soft Lens Products (B & L SENSITIVE EYES SALINE) 0.4 % SOLN, Place 1 drop into both eyes in the morning., Disp: , Rfl:    traMADol  (ULTRAM ) 50 MG tablet, Take 1 tablet (50 mg total) by mouth every 6 (six) hours as needed for moderate pain (pain score 4-6). For AFTER surgery only, do not take and drive, Disp: 10 tablet, Rfl: 0  TURMERIC PO, Take 1 capsule by mouth daily., Disp: , Rfl:   Review of Symptoms: Pertinent positives as per HPI.  Physical Exam: Deferred given limitations of phone visit.  Laboratory & Radiologic Studies: A. RIGHT INTERNAL ILIAC SENTINEL LYMPH NODE, EXCISION:  - One lymph node negative for malignancy (0/1).  - See cancer summary below.   B. LEFT EXTERNAL ILIAC SENTINEL LYMPH NODE, EXCISION:  - One lymph node negative for malignancy (0/1).  - See cancer summary below.   C. LEFT OBTURATOR SENTINEL LYMPH NODE, EXCISION:  - One lymph node negative for malignancy (0/1).  - See cancer summary below.   D. UTERUS INCLUDING CERVIX, BILATERAL FALLOPIAN TUBES AND OVARIES, TOTAL  HYSTERECTOMY WITH BILATERAL SALPINGO-OOPHORECTOMY:  - Endometrioid carcinoma.  - See cancer summary below.  - Background inactive endometrium.  - Unremarkable myometrium.  - Cervix with Nabothian cysts.  - Bilateral fallopian tubes with fimbriated end.  - Benign paratubal cysts.  - Bilateral ovaries with cortical inclusion cysts.   TUMOR Histologic Type: Endometrioid carcinoma Histologic Grade: FIGO grade 1 Molecular Type:      MMR Immunohistochemistry: Loss of  nuclear MMR protein expression of MLH1 and PMS2      p53 Immunohistochemistry: Normal (wild-type) expression Myometrial Invasion: Present, inner half (less than 50%) Uterine Serosa Involvement: Not identified Cervical Involvement: Not identified Other Tissue/Organ Involvement: Not identified Lymphatic and/or Vascular Invasion: Not identified  MARGINS Margin Status: Not applicable  REGIONAL LYMPH NODES Regional Lymph Node Status: All regional lymph nodes negative for tumor cells Lymph Nodes Examined:      Total Number of Pelvic Nodes Examined (sentinel and non-sentinel): 3      Number of Pelvic Sentinel Nodes Examined: 3      Total Number of Para-aortic Nodes Examined (sentinel and non-sentinel): 0      Number of Para-aortic Sentinel Nodes Examined: 0  DISTANT METASTASIS Distant Site(s) Involved, if applicable: Not applicable  PATHOLOGIC STAGE CLASSIFICATION (pTNM, AJCC 8th Edition): Modified Classification: Not applicable pT1a      T Suffix: Not applicable pN0      N Suffix: (sn) pM - Not applicable   Assessment & Plan: Jennifer Cobb is a 53 y.o. woman with Stage IA2 grade 1endometrioid endometrial adenocarcinoma who presents for phone follow-up. P53 WT. MMRd (loss of MLH1 and PMS2)  Doing well, meeting post-op milestones. Discussed continued expectations and restrictions.  Reviewed pathology from surgery. She is very happy with this news. Discussed given early-stage, low risk disease, no adjuvant treatment indicated.  I discussed the assessment and treatment plan with the patient. The patient was provided with an opportunity to ask questions and all were answered. The patient agreed with the plan and demonstrated an understanding of the instructions.   The patient was advised to call back or see an in-person evaluation if the symptoms worsen or if the condition fails to improve as anticipated.   8 minutes of total time was spent for this patient encounter, including  preparation, phone counseling with the patient and coordination of care, and documentation of the encounter.   Comer Dollar, MD  Division of Gynecologic Oncology  Department of Obstetrics and Gynecology  [facility]

## 2023-10-26 ENCOUNTER — Encounter: Payer: Self-pay | Admitting: Gynecologic Oncology

## 2023-10-29 ENCOUNTER — Inpatient Hospital Stay: Admitting: Gynecologic Oncology

## 2023-11-14 ENCOUNTER — Encounter: Payer: Self-pay | Admitting: Oncology

## 2023-11-14 ENCOUNTER — Inpatient Hospital Stay: Attending: Gynecologic Oncology | Admitting: Gynecologic Oncology

## 2023-11-14 ENCOUNTER — Encounter: Payer: Self-pay | Admitting: Gynecologic Oncology

## 2023-11-14 ENCOUNTER — Encounter: Admitting: Gynecologic Oncology

## 2023-11-14 VITALS — BP 121/87 | HR 73 | Temp 98.0°F | Resp 19 | Wt 200.4 lb

## 2023-11-14 DIAGNOSIS — Z7189 Other specified counseling: Secondary | ICD-10-CM

## 2023-11-14 DIAGNOSIS — Z90710 Acquired absence of both cervix and uterus: Secondary | ICD-10-CM

## 2023-11-14 DIAGNOSIS — Z9079 Acquired absence of other genital organ(s): Secondary | ICD-10-CM

## 2023-11-14 DIAGNOSIS — C541 Malignant neoplasm of endometrium: Secondary | ICD-10-CM

## 2023-11-14 DIAGNOSIS — Z90722 Acquired absence of ovaries, bilateral: Secondary | ICD-10-CM

## 2023-11-14 NOTE — Patient Instructions (Signed)
 You are healing very well from surgery.  Please remember, no heavy lifting until 6 weeks and nothing in the vagina for at least 12 weeks.  I will see you for your first follow-up in 6.  If you develop any of the symptoms that we talked about today between visits, please call to see me sooner.  These include vaginal bleeding, pelvic pain, change to bowel or bladder function, unintentional weight loss, or just not feeling well.

## 2023-11-14 NOTE — Progress Notes (Signed)
 Requested MSI and MLH1 promoter hypermethylation on accession [id number] with [facility].

## 2023-11-14 NOTE — Progress Notes (Signed)
 Gynecologic Oncology Return Clinic Visit  11/14/23  Reason for Visit: follow-up, treatment planning  Treatment History: Patient was seen initially in early August for a routine annual exam.  At that time, she endorsed menopause in 2017 with intermittent postmenopausal bleeding for the last 6 months, sometimes heavy and sometimes light. Pelvic ultrasound exam was performed on 8/19 showing a uterus measuring 9 x 4.5 x 5.2 cm with endometrium thickened at 19 mm and an echogenic mass measuring up to 3.2 cm within the endometrium.  Ovaries were not visualized. The patient underwent endometrial biopsy on 10/07/2023.  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   10/16/23: Robotic-assisted laparoscopic total hysterectomy with bilateral salpingoophorectomy, SLN biopsy bilaterally  Interval History: Doing well.  Denies any vaginal bleeding.  Reports good bowel and bladder function.  Denies any abdominal pain.  Past Medical/Surgical History: Past Medical History:  Diagnosis Date   Anemia    Arthritis    Celiac disease    Endometrial cancer [facility])     Past Surgical History:  Procedure Laterality Date   COLONOSCOPY     COLONOSCOPY WITH PROPOFOL  N/A 04/23/2022   Procedure: COLONOSCOPY WITH PROPOFOL ;  Surgeon: Cindie Carlin POUR, DO;  [facility];  Service: Gastroenterology;  Laterality: N/A;  9;00 am, LM to see if pt can move up   EYE SURGERY Left    INJECTION, FOR SENTINEL LYMPH NODE IDENTIFICATION N/A 10/16/2023   Procedure: INJECTION, FOR SENTINEL LYMPH NODE IDENTIFICATION;  Surgeon: Viktoria Comer SAUNDERS, MD;  [facility];  Service: Gynecology;  Laterality: N/A;   LYMPH NODE BIOPSY N/A 10/16/2023   Procedure: LYMPH NODE BIOPSY;  Surgeon: Viktoria Comer SAUNDERS, MD;  [facility];  Service: Gynecology;  Laterality: N/A;   ORIF WRIST FRACTURE Right 08/12/2020   Procedure: Right distal radius open  reduction and internal fixation with repair as indicated;  Surgeon: Shari Easter, MD;  Location: [facility] OR;  Service: Orthopedics;  Laterality: Right;    ROBOTIC ASSISTED TOTAL HYSTERECTOMY WITH BILATERAL SALPINGO OOPHERECTOMY Bilateral 10/16/2023   Procedure: HYSTERECTOMY, TOTAL, ROBOT-ASSISTED, LAPAROSCOPIC, WITH BILATERAL SALPINGO-OOPHORECTOMY;  Surgeon: Viktoria Comer SAUNDERS, MD;  [facility];  Service: Gynecology;  Laterality: Bilateral;    Family History  Problem Relation Age of Onset   Colon cancer Mother    Cancer Mother 12       colon   Bladder Cancer Father    Hypertension Father    Syncope episode Daughter    Crohn's disease Daughter    Autism Son    Breast cancer Neg Hx    Ovarian cancer Neg Hx    Endometrial cancer Neg Hx    Pancreatic cancer Neg Hx    Prostate cancer Neg Hx     Social History   Socioeconomic History   Marital status: Married    Spouse name: Not on file   Number of children: Not on file   Years of education: Not on file   Highest education level: Not on file  Occupational History   Not on file  Tobacco Use   Smoking status: Never   Smokeless tobacco: Never  Vaping Use   Vaping status: Never Used  Substance and Sexual Activity   Alcohol use: No   Drug use: No   Sexual activity: Yes    Birth control/protection: Surgical    Comment: vasectomy  Other Topics Concern   Not on file  Social History  Narrative   Not on file   Social Drivers of Health   Financial Resource Strain: Low Risk  (09/02/2023)   Overall Financial Resource Strain (CARDIA)    Difficulty of Paying Living Expenses: Not hard at all  Food Insecurity: No Food Insecurity (10/10/2023)   Hunger Vital Sign    Worried About Running Out of Food in the Last Year: Never true    Ran Out of Food in the Last Year: Never true  Transportation Needs: No Transportation Needs (10/10/2023)   PRAPARE - Administrator, Civil Service (Medical): No    Lack of Transportation  (Non-Medical): No  Physical Activity: Insufficiently Active (09/02/2023)   Exercise Vital Sign    Days of Exercise per Week: 4 days    Minutes of Exercise per Session: 30 min  Stress: No Stress Concern Present (09/02/2023)   [employer] of Occupational Health - Occupational Stress Questionnaire    Feeling of Stress: Not at all  Social Connections: Socially Integrated (09/02/2023)   Social Connection and Isolation Panel    Frequency of Communication with Friends and Family: More than three times a week    Frequency of Social Gatherings with Friends and Family: Twice a week    Attends Religious Services: More than 4 times per year    Active Member of [employer] or Organizations: Yes    Attends Engineer, structural: More than 4 times per year    Marital Status: Married    Current Medications:  Current Outpatient Medications:    acetaminophen  (TYLENOL ) 500 MG tablet, Take 500-1,000 mg by mouth every 6 (six) hours as needed (pain.)., Disp: , Rfl:    cetirizine (ZYRTEC) 10 MG tablet, Take 10 mg by mouth daily., Disp: , Rfl:    cyanocobalamin (VITAMIN B12) 1000 MCG tablet, Take 1,000 mcg by mouth in the morning., Disp: , Rfl:    ferrous sulfate 325 (65 FE) MG tablet, Take 325 mg by mouth daily with breakfast., Disp: , Rfl:    folic acid (FOLVITE) 400 MCG tablet, Take 400 mcg by mouth in the morning., Disp: , Rfl:    sertraline  (ZOLOFT ) 50 MG tablet, TAKE ONE (1) TABLET BY MOUTH EVERY DAY, Disp: 30 tablet, Rfl: 11   sodium chloride  (OCEAN) 0.65 % SOLN nasal spray, Place 1 spray into both nostrils daily as needed for congestion., Disp: , Rfl:    Soft Lens Products (B & L SENSITIVE EYES SALINE) 0.4 % SOLN, Place 1 drop into both eyes in the morning., Disp: , Rfl:    TURMERIC PO, Take 1 capsule by mouth daily., Disp: , Rfl:   Review of Systems: Denies appetite changes, fevers, chills, fatigue, unexplained weight changes. Denies hearing loss, neck lumps or masses, mouth sores, ringing in  ears or voice changes. Denies cough or wheezing.  Denies shortness of breath. Denies chest pain or palpitations. Denies leg swelling. Denies abdominal distention, pain, blood in stools, constipation, diarrhea, nausea, vomiting, or early satiety. Denies pain with intercourse, dysuria, frequency, hematuria or incontinence. Denies hot flashes, pelvic pain, vaginal bleeding or vaginal discharge.   Denies joint pain, back pain or muscle pain/cramps. Denies itching, rash, or wounds. Denies dizziness, headaches, numbness or seizures. Denies swollen lymph nodes or glands, denies easy bruising or bleeding. Denies anxiety, depression, confusion, or decreased concentration.  Physical Exam: BP 121/87 (BP Location: Left Arm, Patient Position: Sitting)   Pulse 73   Temp 98 F (36.7 C) (Oral)   Resp 19   Wt 200 lb  6.4 oz (90.9 kg)   LMP 09/02/2015 (Exact Date)   SpO2 99%   BMI 33.35 kg/m  General: Alert, oriented, no acute distress. HEENT: Posterior oropharynx clear, sclera anicteric. Chest: Unlabored breathing on room air. Abdomen: soft, nontender.  Normoactive bowel sounds.  No masses or hepatosplenomegaly appreciated.  Well-healed incisions. Extremities: Grossly normal range of motion.  Warm, well perfused.  No edema bilaterally. GU: Normal appearing external genitalia without erythema, excoriation, or lesions.  Speculum exam reveals cuff intact, suture visible.  Bimanual exam reveals cuff intact, no fluctuance or tenderness to palpation.    Laboratory & Radiologic Studies: A. RIGHT INTERNAL ILIAC SENTINEL LYMPH NODE, EXCISION:  - One lymph node negative for malignancy (0/1).  - See cancer summary below.   B. LEFT EXTERNAL ILIAC SENTINEL LYMPH NODE, EXCISION:  - One lymph node negative for malignancy (0/1).  - See cancer summary below.   C. LEFT OBTURATOR SENTINEL LYMPH NODE, EXCISION:  - One lymph node negative for malignancy (0/1).  - See cancer summary below.   D. UTERUS INCLUDING  CERVIX, BILATERAL FALLOPIAN TUBES AND OVARIES, TOTAL  HYSTERECTOMY WITH BILATERAL SALPINGO-OOPHORECTOMY:  - Endometrioid carcinoma.  - See cancer summary below.  - Background inactive endometrium.  - Unremarkable myometrium.  - Cervix with Nabothian cysts.  - Bilateral fallopian tubes with fimbriated end.  - Benign paratubal cysts.  - Bilateral ovaries with cortical inclusion cysts.    TUMOR Histologic Type: Endometrioid carcinoma Histologic Grade: FIGO grade 1 Molecular Type:      MMR Immunohistochemistry: Loss of nuclear MMR protein expression of MLH1 and PMS2      p53 Immunohistochemistry: Normal (wild-type) expression Myometrial Invasion: Present, inner half (less than 50%) Uterine Serosa Involvement: Not identified Cervical Involvement: Not identified Other Tissue/Organ Involvement: Not identified Lymphatic and/or Vascular Invasion: Not identified  MARGINS Margin Status: Not applicable  REGIONAL LYMPH NODES Regional Lymph Node Status: All regional lymph nodes negative for tumor cells Lymph Nodes Examined:      Total Number of Pelvic Nodes Examined (sentinel and non-sentinel): 3      Number of Pelvic Sentinel Nodes Examined: 3      Total Number of Para-aortic Nodes Examined (sentinel and non-sentinel): 0      Number of Para-aortic Sentinel Nodes Examined: 0  DISTANT METASTASIS Distant Site(s) Involved, if applicable: Not applicable  PATHOLOGIC STAGE CLASSIFICATION (pTNM, AJCC 8th Edition): Modified Classification: Not applicable pT1a      T Suffix: Not applicable pN0      N Suffix: (sn) pM - Not applicable   Assessment & Plan: Jennifer Cobb is a 53 y.o. woman with Stage IA2 grade 1endometrioid endometrial adenocarcinoma who presents for follow-up. P53 WT. MMRd (loss of MLH1 and PMS2)  Doing well postoperatively.  Discussed continued expectations and restrictions.  Reviewed pathology from surgery with her again.  She was given a copy of her pathology  report.  Still awaiting microsatellite instability testing and MLH1 promoter hyper methylation.  Given early stage, low risk disease, discussed indication for close surveillance, no adjuvant therapy.  We reviewed signs and symptoms that would be concerning for disease recurrence and I stressed the importance of calling if she develops these between visits.  Per NCCN surveillance recommendations, we will plan on visits alternating with her OB/GYN every 6 months.  I will see her for her first visit.  20 minutes of total time was spent for this patient encounter, including preparation, face-to-face counseling with the patient and coordination of care, and documentation of  the encounter.  Comer Dollar, MD  Division of Gynecologic Oncology  Department of Obstetrics and Gynecology  [facility]

## 2023-11-25 ENCOUNTER — Encounter (HOSPITAL_COMMUNITY): Payer: Self-pay | Admitting: Gynecologic Oncology

## 2023-11-26 ENCOUNTER — Encounter (HOSPITAL_COMMUNITY): Payer: Self-pay | Admitting: Gynecologic Oncology

## 2023-11-28 LAB — SURGICAL PATHOLOGY

## 2023-11-29 ENCOUNTER — Encounter: Payer: Self-pay | Admitting: Gynecologic Oncology

## 2023-12-01 ENCOUNTER — Encounter: Payer: Self-pay | Admitting: Gynecologic Oncology

## 2023-12-01 ENCOUNTER — Telehealth: Payer: Self-pay | Admitting: *Deleted

## 2023-12-01 ENCOUNTER — Encounter: Payer: Self-pay | Admitting: Oncology

## 2023-12-01 DIAGNOSIS — C541 Malignant neoplasm of endometrium: Secondary | ICD-10-CM

## 2023-12-01 DIAGNOSIS — Z800 Family history of malignant neoplasm of digestive organs: Secondary | ICD-10-CM

## 2023-12-01 NOTE — Progress Notes (Signed)
 Referral placed for genetic counseling per Dr. Viktoria.

## 2023-12-01 NOTE — Progress Notes (Signed)
 I called the patient in response to her my chart message. We discussed results of her MSI and MLH 1 promoter hyper methylation testing. Despite testing results indicating more likely that this was a sporadic cancer, given her family history of colon cancer, I think genetic testing would be very reasonable. Patient interested in referral to genetic counselors for germline testing.  Comer Dollar MD

## 2023-12-01 NOTE — Telephone Encounter (Signed)
 Per Dr Viktoria patient scheduled for genetics. Patient aware of appt date/time

## 2023-12-10 ENCOUNTER — Telehealth: Payer: Self-pay

## 2023-12-10 NOTE — Telephone Encounter (Signed)
 Pt's husband dropped off a cancer policy to be filled out by our office on Ms.Deschene.   Requested information provided and emailed to Ms.Delfin per her husbands request.

## 2024-01-10 ENCOUNTER — Other Ambulatory Visit: Payer: Self-pay | Admitting: Obstetrics & Gynecology

## 2024-01-20 NOTE — Progress Notes (Addendum)
 REFERRING PROVIDER: Alphonsa Jennifer LABOR, MD [address] [place],  [place] [address]  PRIMARY PROVIDER:  Alphonsa Jennifer LABOR, MD  PRIMARY REASON FOR VISIT:  1. Endometrial cancer (HCC)   2. Family history of colon cancer   3. Family history of breast cancer    HISTORY OF PRESENT ILLNESS:   Jennifer Cobb, a 53 y.o. female, was seen for a [facility] [place] cancer genetics consultation at the request of Jennifer Alphonsa, MD due to a personal history of endometrial cancer and a family history of colon cancer.  Jennifer Cobb presents to clinic today to discuss the possibility of a hereditary predisposition to cancer, genetic testing, and to further clarify her future cancer risks, as well as potential cancer risks for family members.   Diagnosis: In September 2025, at the age of 59, Jennifer Cobb was diagnosed with grade 1 endometrioid adenocarcinoma.  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 The treatment has included total hysterectomy with bilateral salping oophorectomy.   CANCER HISTORY:  Oncology History  Endometrial cancer (HCC)  10/16/2023 Initial Diagnosis   Endometrial cancer (HCC)    SOMATIC TUMOR TESTING Microsatellite Instability  Lab: [facility] Report Date: 11/25/2023 Report:  MLH1 Methylation  Lab: [facility] Report Date: 11/25/2023 Report:  RISK FACTORS:  Menarche was at age 38.  First live birth at age 52.  OCP use for approximately 3 years.  Oophorectomy: yes (2025).  Hysterectomy: yes (2025) Menopausal status: postmenopausal (menopause at 48 in 2017) HRT use: 0 years. Colonoscopy: yes; normal. Mammogram within the last year: yes (09/10/2023). Number of breast biopsies: 1 Benign Finding Any excessive radiation exposure or other environmental exposures the past: yes (Pesticide exposure in childhood) Tobacco Use: Never  Past Medical History:  Diagnosis Date   Anemia    Arthritis    Celiac disease    Endometrial cancer (HCC)    Past  Surgical History:  Procedure Laterality Date   COLONOSCOPY     COLONOSCOPY WITH PROPOFOL  N/A 04/23/2022   Procedure: COLONOSCOPY WITH PROPOFOL ;  Surgeon: Jennifer Carlin POUR, DO;  Location: [facility];  Service: Gastroenterology;  Laterality: N/A;  9;00 am, LM to see if pt can move up   EYE SURGERY Left    INJECTION, FOR SENTINEL LYMPH NODE IDENTIFICATION N/A 10/16/2023   Procedure: INJECTION, FOR SENTINEL LYMPH NODE IDENTIFICATION;  Surgeon: Jennifer Comer SAUNDERS, MD;  Location: [facility];  Service: Gynecology;  Laterality: N/A;   LYMPH NODE BIOPSY N/A 10/16/2023   Procedure: LYMPH NODE BIOPSY;  Surgeon: Jennifer Comer SAUNDERS, MD;  Location: [facility];  Service: Gynecology;  Laterality: N/A;   ORIF WRIST FRACTURE Right 08/12/2020   Procedure: Right distal radius open reduction and internal fixation with repair as indicated;  Surgeon: Jennifer Easter, MD;  Location: [facility];  Service: Orthopedics;  Laterality: Right;    ROBOTIC ASSISTED TOTAL HYSTERECTOMY WITH BILATERAL SALPINGO OOPHERECTOMY Bilateral 10/16/2023   Procedure: HYSTERECTOMY, TOTAL, ROBOT-ASSISTED, LAPAROSCOPIC, WITH BILATERAL SALPINGO-OOPHORECTOMY;  Surgeon: Jennifer Comer SAUNDERS, MD;  Location: [facility];  Service: Gynecology;  Laterality: Bilateral;    Social History   Socioeconomic History   Marital status: Married    Spouse name: Not on file   Number of children: Not on file   Years of education: Not on file   Highest education level: Not on file  Occupational History   Not on file  Tobacco Use   Smoking status: Never   Smokeless tobacco: Never  Vaping Use   Vaping  status: Never Used  Substance and Sexual Activity   Alcohol use: No   Drug use: No   Sexual activity: Yes    Birth control/protection: Surgical    Comment: vasectomy  Other Topics Concern   Not on file  Social History Narrative   Not on file   Social Drivers of Health   Tobacco Use: Low Risk (11/14/2023)   Patient History    Smoking Tobacco Use: Never     Smokeless Tobacco Use: Never    Passive Exposure: Not on file  Financial Resource Strain: Low Risk (09/02/2023)   Overall Financial Resource Strain (CARDIA)    Difficulty of Paying Living Expenses: Not hard at all  Food Insecurity: No Food Insecurity (10/10/2023)   Epic    Worried About Programme Researcher, Broadcasting/film/video in the Last Year: Never true    Ran Out of Food in the Last Year: Never true  Transportation Needs: No Transportation Needs (10/10/2023)   Epic    Lack of Transportation (Medical): No    Lack of Transportation (Non-Medical): No  Physical Activity: Insufficiently Active (09/02/2023)   Exercise Vital Sign    Days of Exercise per Week: 4 days    Minutes of Exercise per Session: 30 min  Stress: No Stress Concern Present (09/02/2023)   [employer] of Occupational Health - Occupational Stress Questionnaire    Feeling of Stress: Not at all  Social Connections: Socially Integrated (09/02/2023)   Social Connection and Isolation Panel    Frequency of Communication with Friends and Family: More than three times a week    Frequency of Social Gatherings with Friends and Family: Twice a week    Attends Religious Services: More than 4 times per year    Active Member of Clubs or Organizations: Yes    Attends Banker Meetings: More than 4 times per year    Marital Status: Married  Depression (PHQ2-9): Low Risk (10/10/2023)   Depression (PHQ2-9)    PHQ-2 Score: 0  Alcohol Screen: Low Risk (09/02/2023)   Alcohol Screen    Last Alcohol Screening Score (AUDIT): 0  Housing: Low Risk (10/10/2023)   Epic    Unable to Pay for Housing in the Last Year: No    Number of Times Moved in the Last Year: 0    Homeless in the Last Year: No  Utilities: Not At Risk (10/10/2023)   Epic    Threatened with loss of utilities: No  Health Literacy: Not on file     FAMILY HISTORY:  We obtained a detailed, 4-generation family history pasted below.   Jennifer Cobb is unaware of relatives completing genetic  testing for hereditary cancer risks.  There is no reported Ashkenazi Jewish ancestry.  There is no known consanguinity.  Pedigree Summary Mother - Colon Cancer dx. 79 Sister of Maternal Grandmother - Breast Cancer  Mother of Maternal Grandmother - Colon Cancer dx. 50+ Father - Bladder Cancer dx. 70  Significant diagnoses are listed below: Family History  Problem Relation Age of Onset   Colon cancer Mother 6   Bladder Cancer Father 65   Hypertension Father    Colon polyps Sister    Syncope episode Daughter    Crohn's disease Daughter    Autism Son    Colon cancer Maternal Great-grandmother        dx. 50+   Breast cancer Other        Sister of maternal grandmother   Ovarian cancer Neg Hx    Endometrial cancer  Neg Hx    Pancreatic cancer Neg Hx    Prostate cancer Neg Hx    DISCUSSION: We discussed that, in general, most cancer is not inherited in families, but instead is sporadic or familial. Sporadic cancers occur by chance and typically happen at older ages (>50 years) as this type of cancer is caused by genetic changes acquired during an individuals lifetime. Some families have more cancers than would be expected by chance; however, the ages or types of cancer are not consistent with a known genetic mutation or known genetic mutations have been ruled out. This type of familial cancer is thought to be due to a combination of multiple genetic, environmental, hormonal, and lifestyle factors. While this combination of factors likely increases the risk of cancer, the exact source of this risk is not currently identifiable or testable.  Most colon cancers are not due to an inherited mutation.  That is, most cases of colon cancer happen by chance and are considered sporadic.  However, when we see an early age at diagnosis, we become suspicious of an inherited risk of certain cancers.  The syndrome we were most concerned about in your family is called Lynch Syndrome, also called HNPCC  (Hereditary Non-Polyposis Colon Cancer).  This syndrome increases the chances for mainly colon, uterine and ovarian cancers, as well as others.  Families with Lynch Syndrome tend to have multiple family members with these cancers, typically diagnosed under age 83, and diagnoses in multiple generations. These features are all present in your family.  Tumor Genetic Testing  We discussed her tumor testing and why the results of testing suggested her cancer is likely sporadic. We reviewed the two different molecular tests run on her endometrial tumor tissue. Microsatellite instability (MSI) - Testing to determine if cancer cells have a large amount of genetic variants (mutations) within short repetitive regions of DNA called microsatellites. Cells with a significant amount of genetic variants in these regions are termed microsatellite instability-high (MSI-High). MSI-high tumors can be associated with germline variants (mutations) in the mismatch repair proteins (MLH1, MSH2, MSH6 and PMS2) associated with Lynch Syndrome or be sporadic. Lynch Syndrome is a hereditary cancer syndrome associated with increased risk for colon, uterine and ovarian cancers, as well as others. Alternately, impaired expression of a mismatch repair gene can also result in an MSI-High result, therefore testing such as MLH1 Methylation studies can help further understand the cause of an MSI-high result. RESULT: Testing revealed microsatellite instability, (MSI-high) and MLH1 Methylation studies were ordered MLH1 Methylation - Testing to determine if the MLH1 promoter is methylated and therefore the protein is silenced/non-functional. MLH1 methylation is seen in approximately 15% of endometrial cancers. MLH1 methylation is largely associated with sporadic cancers and less commonly seen in Lynch Syndrome. 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   We reviewed the characteristics,  features and inheritance patterns of hereditary cancer syndromes. We also discussed genetic testing, including the appropriate family members to test, the process of testing, insurance coverage and turn-around-time for results. We discussed the implications of a negative, positive, carrier and/or variant of uncertain significant result. Jennifer Cobb  was offered a common hereditary cancer panel (40 genes) and an expanded pan-cancer panel (77 genes). Jennifer Cobb was informed of the benefits and limitations of each panel, including that expanded pan-cancer panels contain genes that do not have clear management guidelines at this point in time.  We also discussed that as the number of genes  included on a panel increases, the chances of variants of uncertain significance increases.  GENETIC TESTING NATIONAL CRITERIA: Based on Jennifer Cobb's personal and family history of cancer she meets medical criteria for genetic testing based on the [employer] (NCCN) guidelines.  Despite that she meets criteria, she  may still have an out of pocket cost. We discussed that she will get a text from [employer] with billing formation and her out-of-pocket cost estimate.   GENETIC TESTING CONSENT:  After considering the risks, benefits, and limitations, Jennifer Cobb provided informed consent to pursue genetic testing. A blood sample was sent to [facility] for analysis of the CancerNext-Expanded+RNA Panel. Results should be available within approximately 2-3 weeks' time, at which point they will be disclosed by telephone to Jennifer Cobb , as will any additional recommendations warranted by these results. Jennifer Cobb will receive a summary of her genetic counseling visit and a copy of her results once available. This information will also be available in Epic.  [facility] CancerNext-Expanded + RNAinsight gene panel which includes sequencing, rearrangement, and RNA analysis for the following 77  genes: AIP, ALK, APC, ATM, AXIN2, BAP1, BARD1, BMPR1A, BRCA1, BRCA2, BRIP1, CDC73, CDH1, CDK4, CDKN1B, CDKN2A, CEBPA, CHEK2, CTNNA1, DDX41, DICER1, ETV6, FH, FLCN, GATA2, LZTR1, MAX, MBD4, MEN1, MET, MLH1, MSH2, MSH3, MSH6, MUTYH, NF1, NF2, NTHL1, PALB2, PHOX2B, PMS2, POT1, PRKAR1A, PTCH1, PTEN, RAD51C, RAD51D, RB1, RET, RPS20, RUNX1, SDHA, SDHAF2, SDHB, SDHC, SDHD, SMAD4, SMARCA4, SMARCB1, SMARCE1, STK11, SUFU, TMEM127, TP53, TSC1, TSC2, VHL, and WT1 (sequencing and deletion/duplication); EGFR, HOXB13, KIT, MITF, PDGFRA, POLD1, and POLE (sequencing only); EPCAM and GREM1 (deletion/duplication only).    GENETIC INFORMATION NONDISCRIMINATION ACT (GINA): We discussed that some people do not want to undergo genetic testing due to fear of genetic discrimination.  The Genetic Information Nondiscrimination Act (GINA) was signed into federal law in 2008. GINA prohibits health insurers and most employers from discriminating against individuals based on genetic information (including the results of genetic tests and family history information). According to GINA, health insurance companies cannot consider genetic information to be a preexisting condition, nor can they use it to make decisions regarding coverage or rates. GINA also makes it illegal for most employers to use genetic information in making decisions about hiring, firing, promotion, or terms of employment. It is important to note that GINA does not offer protections for life insurance, disability insurance, or long-term care insurance. GINA does not apply to those in the [employer], those who work for companies with less than 15 employees, and new life insurance or long-term disability insurance policies.  Health status due to a cancer diagnosis is not protected under GINA. More information about GINA can be found by visiting [email or web address]. Lastly, we encouraged Jennifer Cobb to remain in contact with cancer genetics annually so that we can continuously  update the family history and inform her of any changes in cancer genetics and testing that may be of benefit for this family.   Jennifer Cobb's questions were answered to her satisfaction today. Our contact information was provided should additional questions or concerns arise. Thank you for the referral and allowing us  to share in the care of your patient.   Resources:  Jennifer Cobb was provided with the following:  [facility] Billing information  [facility] Hereditary Cancer Testing Patient Guide [facility] CancerNext-Expanded + RNAinsight gene list Copies of her MSI and MLH1 - Methylation test results  PLAN:  Testing Ordered: CancerNext-Expanded + RNAinsight  Clinic Note Faxed/Routed to [employer]  Jennifer Cobb Jennifer Jennifer LABOR, MD   REFERENCES [place] LACY, [place] BARTHOLOMEW Ferrari MD, [place] CHRISTELLA Kingfisher AM, Edsel MARLA Eveline DEITRA, [place], [place]ose FA, Harl GREW, Smitty SAHA, Pearson SA, [place] CHRISTELLA, [place], [place] PB, [place], [place], [place] CJ, [place] A, [place], [place] PM, [place] AB. Tumor mismatch repair immunohistochemistry and DNA MLH1 methylation testing of patients with endometrial cancer diagnosed at age younger than 50 years optimizes triage for population-level germline mismatch repair gene mutation testing. J Clin Oncol. 2014 Jan 10;32(2):90-100. doi: 10.1200/JCO.2013.51.2129. Epub 2013 Dec 9. PMID: 75676967; PMCID: [id number]. Bettstetter M, Dechant S, Ruemmele P, Erskine CHRISTELLA Sonda KANDICE, Holinski-Feder E, Hetty Cobb Schooling F, Dietmaier W. Distinction of hereditary nonpolyposis colorectal cancer and sporadic microsatellite-unstable colorectal cancer through quantification of MLH1 methylation by real-time PCR. Clin Cancer Res. 2007 Jun 1;13(11):3221-8. doi: 10.1158/1078-0432.559-305-2573. PMID: 82454473. Sonya BRASS, Billingsley CC, Lankes HA, Hildegard GORMAN Charon DE, [place] RJ, [place], [place] CC, [place] H, [place] AS, [place], [place], [place], [place], [place],  [place], [place], DiSil[place] PA, [place], [place], [place], [place] RJ, [place] D. Combined Microsatellite Instability, MLH1 Methylation Analysis, and Immunohistochemistry for Lynch Syndrome Screening in Endometrial Cancers From [id number]: An NRG Oncology and Gynecologic Oncology Group Study. J Clin Oncol. 2015 Dec 20;33(36):4301-8. doi: 10.1200/JCO.2015.63.9518. Epub 2015 Nov 9. PMID: 73447580; PMCID: [id number].  Santana Fryer, MS, CGC  Certified Genetic Counselor  Email: [email or web address]  Phone: [phone]  I personally spent a total of 60 minutes in the care of the patient today including preparing to see the patient, getting/reviewing separately obtained history, counseling and educating, placing orders, and documenting clinical information in the EHR.  The patient was joined by her husband, Jennifer Cobb. Drs. Lanny Stalls, and/or Gudena were available for questions, if needed. _______________________________________________________________________ For Office Staff:  Number of people involved in session: 2 Was an Intern/ student involved with case: no

## 2024-01-26 ENCOUNTER — Inpatient Hospital Stay: Attending: Gynecologic Oncology

## 2024-01-26 ENCOUNTER — Inpatient Hospital Stay

## 2024-01-26 ENCOUNTER — Other Ambulatory Visit: Payer: Self-pay

## 2024-01-26 DIAGNOSIS — C541 Malignant neoplasm of endometrium: Secondary | ICD-10-CM | POA: Diagnosis not present

## 2024-01-26 DIAGNOSIS — Z800 Family history of malignant neoplasm of digestive organs: Secondary | ICD-10-CM

## 2024-01-26 DIAGNOSIS — Z803 Family history of malignant neoplasm of breast: Secondary | ICD-10-CM | POA: Diagnosis not present

## 2024-01-26 DIAGNOSIS — Z8052 Family history of malignant neoplasm of bladder: Secondary | ICD-10-CM | POA: Insufficient documentation

## 2024-01-26 LAB — GENETIC SCREENING ORDER

## 2024-02-10 ENCOUNTER — Ambulatory Visit: Payer: Self-pay

## 2024-02-10 ENCOUNTER — Telehealth: Payer: Self-pay

## 2024-02-10 DIAGNOSIS — Z1379 Encounter for other screening for genetic and chromosomal anomalies: Secondary | ICD-10-CM | POA: Insufficient documentation

## 2024-02-10 NOTE — Progress Notes (Signed)
 HPI:  Annalucia Laino was previously seen in the [facility] due to a personal and family history of cancer and concerns regarding a hereditary predisposition to cancer. Please refer to our prior cancer genetics clinic note for more information regarding our discussion, assessment and recommendations, at the time. Venezia Maurer's recent genetic test results were disclosed to her, as were recommendations warranted by these results. These results and recommendations are discussed in more detail below.  Jahzara Slattery results were disclosed by phone on 02/10/2024.   CANCER HISTORY:  Oncology History  Endometrial cancer (HCC)  10/16/2023 Initial Diagnosis   Endometrial cancer (HCC)    Genetic Testing   Negative genetic testing. No pathogenic variants identified on the [facility] CancerNext-Expanded+RNA Panel.  Report Date: 02/26/2023 Result: Negative Genetic Testing Ordered: [facility] CancerNext-Expanded + RNAinsight gene panel which includes sequencing, rearrangement, and RNA analysis for the following 77 genes: AIP, ALK, APC, ATM, AXIN2, BAP1, BARD1, BMPR1A, BRCA1, BRCA2, BRIP1, CDC73, CDH1, CDK4, CDKN1B, CDKN2A, CEBPA, CHEK2, CTNNA1, DDX41, DICER1, ETV6, FH, FLCN, GATA2, LZTR1, MAX, MBD4, MEN1, MET, MLH1, MSH2, MSH3, MSH6, MUTYH, NF1, NF2, NTHL1, PALB2, PHOX2B, PMS2, POT1, PRKAR1A, PTCH1, PTEN, RAD51C, RAD51D, RB1, RET, RPS20, RUNX1, SDHA, SDHAF2, SDHB, SDHC, SDHD, SMAD4, SMARCA4, SMARCB1, SMARCE1, STK11, SUFU, TMEM127, TP53, TSC1, TSC2, VHL, and WT1 (sequencing and deletion/duplication); EGFR, HOXB13, KIT, MITF, PDGFRA, POLD1, and POLE (sequencing only); EPCAM and GREM1 (deletion/duplication only).     FAMILY HISTORY:  We obtained a detailed, 4-generation family history pasted below.   Aarthi Uyeno is unaware of relatives completing genetic testing for hereditary cancer risks.  There is no reported Ashkenazi Jewish ancestry.  There is no known consanguinity.   Pedigree  Summary Mother - Colon Cancer dx. 16 Sister of Maternal Grandmother - Breast Cancer  Mother of Maternal Grandmother - Colon Cancer dx. 50+ Father - Bladder Cancer dx. 53  Family History  Problem Relation Age of Onset   Colon cancer Mother 63   Bladder Cancer Father 70   Hypertension Father    Colon polyps Sister    Syncope episode Daughter    Crohn's disease Daughter    Autism Son    Colon cancer Maternal Great-grandmother        dx. 50+   Breast cancer Other        Sister of maternal grandmother   Ovarian cancer Neg Hx    Endometrial cancer Neg Hx    Pancreatic cancer Neg Hx    Prostate cancer Neg Hx    GENETIC TEST RESULTS: Genetic testing reported out on 02/05/2024 through the [facility] CancerNext-Expanded+RNAinsight panel found no pathogenic mutations.   [facility] CancerNext-Expanded + RNAinsight gene panel which includes sequencing, rearrangement, and RNA analysis for the following 77 genes: AIP, ALK, APC, ATM, AXIN2, BAP1, BARD1, BMPR1A, BRCA1, BRCA2, BRIP1, CDC73, CDH1, CDK4, CDKN1B, CDKN2A, CEBPA, CHEK2, CTNNA1, DDX41, DICER1, ETV6, FH, FLCN, GATA2, LZTR1, MAX, MBD4, MEN1, MET, MLH1, MSH2, MSH3, MSH6, MUTYH, NF1, NF2, NTHL1, PALB2, PHOX2B, PMS2, POT1, PRKAR1A, PTCH1, PTEN, RAD51C, RAD51D, RB1, RET, RPS20, RUNX1, SDHA, SDHAF2, SDHB, SDHC, SDHD, SMAD4, SMARCA4, SMARCB1, SMARCE1, STK11, SUFU, TMEM127, TP53, TSC1, TSC2, VHL, and WT1 (sequencing and deletion/duplication); EGFR, HOXB13, KIT, MITF, PDGFRA, POLD1, and POLE (sequencing only); EPCAM and GREM1 (deletion/duplication only).   The test report has been scanned into EPIC and is located under the Molecular Pathology section of the Results Review tab.  A portion of the result report is included below for reference.   We discussed with Lolita Starch that  because current genetic testing is not perfect, it is possible there may be a gene mutation in one of these genes that current testing cannot detect, but that chance is small.  We  also discussed, that there could be another gene that has not yet been discovered, or that we have not yet tested, that is responsible for the cancer diagnoses in the family. It is also possible there is a hereditary cause for the cancer in the family that Liela Rylee did not inherit and therefore was not identified in her testing.  Therefore, it is important to remain in touch with cancer genetics in the future so that we can continue to offer D[place] [place] the most up to date genetic testing.   ADDITIONAL GENETIC TESTING: We discussed with Addaline Peplinski that her genetic testing was fairly extensive.  If there are genes identified to increase cancer risk that can be analyzed in the future, we would be happy to discuss and coordinate this testing at that time.    CANCER SCREENING RECOMMENDATIONS: Giuseppina Fronek's test result is considered negative (normal).  This means that we have not identified a hereditary cause for her personal and family history of cancer at this time. Most cancers happen by chance and this negative test suggests that her personal and family history of cancer may fall into this category.    Possible reasons for Emeri Goga's negative genetic test include:  1. There may be a gene mutation in one of these genes that current testing methods cannot detect but that chance is small.  2. There could be another gene that has not yet been discovered, or that we have not yet tested, that is responsible for the cancer diagnoses in the family.  3.  There may be no hereditary risk for cancer in the family. The cancers in Leanza Shepperson and/or her family may be sporadic/familial or due to other genetic and environmental factors. 4. It is also possible there is a hereditary cause for the cancer in the family that Melayah Skorupski did not inherit.  Therefore, it is recommended she continue to follow the cancer management and screening guidelines provided by her oncology and primary healthcare  providers. An individual's cancer risk and medical management are not determined by genetic test results alone. Overall cancer risk assessment incorporates additional factors, including personal medical history, family history, and any available genetic information that may result in a personalized plan for cancer prevention and surveillance  Given Kaleisha Prowell's personal and family histories, we must interpret these negative results with some caution.  Families with features suggestive of hereditary risk for cancer tend to have multiple family members with cancer, diagnoses in multiple generations and diagnoses before the age of 26. Rachel Vandivier's family exhibits some of these features. Thus, this result may simply reflect our current inability to detect all mutations within these genes or there may be a different gene that has not yet been discovered or tested.   An individual's cancer risk and medical management are not determined by genetic test results alone. Overall cancer risk assessment incorporates additional factors, including personal medical history, family history, and any available genetic information that may result in a personalized plan for cancer prevention and surveillance.  RECOMMENDATIONS FOR FAMILY MEMBERS:  Individuals in this family might be at some increased risk of developing cancer, over the general population risk, simply due to the family history of cancer.  We recommended women in this family have a yearly mammogram beginning at age 36,  or 10 years younger than the earliest onset of cancer, an annual clinical breast exam, and perform monthly breast self-exams. Women in this family should also have a gynecological exam as recommended by their primary provider. All family members should be referred for colonoscopy starting at age 41, or 17 years younger than the earliest onset of cancer.   FOLLOW-UP: Lastly, we discussed with Eleshia Wooley that cancer genetics is a rapidly  advancing field and it is possible that new genetic tests will be appropriate for her and/or her family members in the future. We encouraged her to remain in contact with cancer genetics on an annual basis so we can update her personal and family histories and let her know of advances in cancer genetics that may benefit this family.   Our contact number was provided. Layni Wannamaker's questions were answered to her satisfaction, and she knows she is welcome to call us  at anytime with additional questions or concerns.   Santana Fryer, MS, CGC  Certified Genetic Counselor  Email: [email or web address]  Phone: [phone]

## 2024-02-10 NOTE — Telephone Encounter (Signed)
 I contacted  Ms. Carpino to discuss her genetic testing results. No pathogenic variants were identified in the 77 genes analyzed. Discussed that we do not know why she has  cancer or why there is cancer in the family. It could be due to a different gene that we are not testing, or maybe our current technology may not be able to pick something up.  It will be important for her to keep in contact with genetics to keep up with whether additional testing may be needed.Detailed clinic note to follow.   The test report will be scanned into EPIC and will be located under the Molecular Pathology section of the Results Review tab.  A portion of the result report is included below for reference.   Santana Fryer, MS, CGC  Certified Genetic Counselor  Email: [email or web address]  Phone: [phone]\

## 2024-05-14 ENCOUNTER — Inpatient Hospital Stay: Admitting: Gynecologic Oncology
